# Patient Record
Sex: Male | Born: 1945 | Race: Black or African American | Hispanic: No
Health system: Southern US, Community
[De-identification: ages and names within clinical notes are randomized; demographics above are authoritative.]

## PROBLEM LIST (undated history)

## (undated) DIAGNOSIS — K651 Peritoneal abscess: Secondary | ICD-10-CM

## (undated) DIAGNOSIS — F4310 Post-traumatic stress disorder, unspecified: Secondary | ICD-10-CM

## (undated) DIAGNOSIS — G629 Polyneuropathy, unspecified: Secondary | ICD-10-CM

## (undated) DIAGNOSIS — E119 Type 2 diabetes mellitus without complications: Secondary | ICD-10-CM

## (undated) DIAGNOSIS — F0390 Unspecified dementia without behavioral disturbance: Secondary | ICD-10-CM

## (undated) DIAGNOSIS — D509 Iron deficiency anemia, unspecified: Secondary | ICD-10-CM

## (undated) HISTORY — PX: OSTOMY: SHX5997

## (undated) HISTORY — PX: REVISION UROSTOMY CUTANEOUS: SUR1282

---

## 2013-12-15 MED ORDER — NALOXONE 0.4 MG/ML INJECTION
0.4 mg/mL | INTRAMUSCULAR | Status: DC
Start: 2013-12-15 — End: 2013-12-15

## 2013-12-15 MED ORDER — SODIUM CHLORIDE 0.9% BOLUS IV
0.9 % | Freq: Once | INTRAVENOUS | Status: DC
Start: 2013-12-15 — End: 2013-12-16

## 2013-12-15 MED ORDER — SODIUM CHLORIDE 0.9 % IJ SYRG
INTRAMUSCULAR | Status: DC | PRN
Start: 2013-12-15 — End: 2013-12-16

## 2013-12-15 MED ADMIN — naloxone (NARCAN) 0.4 mg/mL injection: INTRAVENOUS | NDC 00409121501

## 2013-12-15 MED FILL — NALOXONE 0.4 MG/ML INJECTION: 0.4 mg/mL | INTRAMUSCULAR | Qty: 3

## 2013-12-15 NOTE — ED Notes (Signed)
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 Brought in by friend, was unresponsive and ashen. Narcan give, pt is now awake

## 2013-12-15 NOTE — ED Provider Notes (Signed)
Patient is a 68 y.o. male presenting with Ingested Medication. The history is provided by the patient, a friend and the EMS personnel. No language interpreter was used.   Drug Overdose  This is a new problem. The current episode started less than 1 hour ago. The problem occurs constantly. The problem has been gradually improving. Associated symptoms include shortness of breath. Pertinent negatives include no chest pain, no abdominal pain and no headaches. Nothing aggravates the symptoms. Nothing relieves the symptoms. He has tried nothing for the symptoms.        Past Medical History   Diagnosis Date   ??? Hypertension    ??? Diabetes    ??? Cancer      prostate        Past Surgical History   Procedure Laterality Date   ??? Hx prostatectomy           History reviewed. No pertinent family history.     History     Social History   ??? Marital Status: N/A     Spouse Name: N/A     Number of Children: N/A   ??? Years of Education: N/A     Occupational History   ??? Not on file.     Social History Main Topics   ??? Smoking status: Former Smoker   ??? Smokeless tobacco: Not on file   ??? Alcohol Use: Yes   ??? Drug Use: Yes     Special: Heroin   ??? Sexually Active: Not on file     Other Topics Concern   ??? Not on file     Social History Narrative   ??? No narrative on file                  ALLERGIES: Review of patient's allergies indicates no known allergies.      Review of Systems   Constitutional: Negative.  Negative for fever, chills and diaphoresis.   HENT: Negative.  Negative for congestion, sore throat and rhinorrhea.    Respiratory: Positive for apnea and shortness of breath. Negative for cough and chest tightness.    Cardiovascular: Negative.  Negative for chest pain, palpitations and leg swelling.   Gastrointestinal: Negative.  Negative for nausea, vomiting, abdominal pain and diarrhea.   Genitourinary: Negative.  Negative for dysuria, urgency and hematuria.   Musculoskeletal: Negative.  Negative for myalgias and arthralgias.   Skin:  Negative.  Negative for rash.   Allergic/Immunologic: Negative.  Negative for immunocompromised state.   Neurological: Negative.  Negative for weakness, light-headedness and headaches.   All other systems reviewed and are negative.        Filed Vitals:    12/15/13 1844   BP: 206/79   Resp: 11   Height: 5\' 11"  (1.803 Lezley Bedgood)   Weight: 117.935 kg (260 lb)   SpO2: 100%            Physical Exam   Nursing note and vitals reviewed.  Constitutional: He is oriented to person, place, and time. He appears well-developed and well-nourished. He appears distressed.   HENT:   Head: Normocephalic and atraumatic.   Eyes: Conjunctivae are normal. Pupils are equal, round, and reactive to light. Right eye exhibits no discharge. Left eye exhibits no discharge.   Cardiovascular: Normal rate, regular rhythm and normal heart sounds.  Exam reveals no gallop and no friction rub.    No murmur heard.  Pulmonary/Chest: Effort normal and breath sounds normal. No respiratory distress. He has no wheezes. He  has no rales.   Neurological: He is oriented to person, place, and time. He is unresponsive. GCS eye subscore is 1. GCS verbal subscore is 1. GCS motor subscore is 1.   Skin: He is not diaphoretic.        MDM     Differential Diagnosis; Clinical Impression; Plan:     68 y.o. male brought in by friends stating that he had overdosed on Heroine. Patient was unresponsive and not breathing. Patient was given   Narcan and started breathing and became alert and talking.   Blood pressure 206/79, resp. rate 11, height 5\' 11"  (1.803 Jasn Xia), weight 117.935 kg (260 lb), SpO2 100.00%.    Plan:     Medicate, labs, fluids and reevaluate  Progress:   Patient progress:  Stable      Procedures    Labs Reviewed   METABOLIC PANEL, COMPREHENSIVE - Abnormal; Notable for the following:     Glucose 204 (*)     BUN 21 (*)     GFR est non-AA 59 (*)     AST 59 (*)     Albumin 3.2 (*)     All other components within normal limits   CBC WITH AUTOMATED DIFF - Abnormal; Notable for  the following:     RBC 3.67 (*)     HGB 11.2 (*)     HCT 30.7 (*)     MCHC 36.5 (*)     RDW 15.3 (*)     ABS. MONOCYTES 0.3 (*)     All other components within normal limits   TROPONIN I     CXR 8:33 PM   [x]  Viewed by me    [x]  Interpreted by me  []  No acute disease   []  Abnormal : see below   []  Discussed with       Radiologist     []  Normal mediastinum []  No infiltrates  []  Normal heart size   Interpretation: no infiltrate noted

## 2013-12-15 NOTE — ED Notes (Signed)
Call placed to patient's friend Ms Forde RadonCarletta who stated that she would come to ED to pick him up.

## 2013-12-15 NOTE — ED Notes (Signed)
Mr. Herbert Patterson stated that [place] came from [place] about a week ago to [facility].  He said that he visited some old friends and end up overdosing on heroin.  Mr. Herbert Patterson stated that he have been abusing substances off and on for over 35 years.  Patient was offered assistance with getting on the road to recovery, but he said that he is set-up to get treatment at the [facility].  I commended Mr. Herbert Patterson on taking that first step to fight his disease of addiction.  Patient was given [place]'s contact information.

## 2013-12-15 NOTE — ED Notes (Addendum)
Resting quietly. Voices no new questions/ complaints NAD!

## 2013-12-16 LAB — METABOLIC PANEL, COMPREHENSIVE
A-G Ratio: 1 (ref 1.0–3.1)
ALT (SGPT): 49 U/L (ref 12.0–78.0)
AST (SGOT): 59 U/L — ABNORMAL HIGH (ref 15–37)
Albumin: 3.2 g/dL — ABNORMAL LOW (ref 3.40–5.00)
Alk. phosphatase: 72 U/L (ref 46–116)
Anion gap: 16 mmol/L (ref 10–17)
BUN/Creatinine ratio: 16 (ref 6.0–20.0)
BUN: 21 MG/DL — ABNORMAL HIGH (ref 7–18)
Bilirubin, total: 0.4 MG/DL (ref 0.20–1.00)
CO2: 24 mmol/L (ref 21–32)
Calcium: 8.8 MG/DL (ref 8.5–10.1)
Chloride: 107 mmol/L (ref 98–107)
Creatinine: 1.3 MG/DL (ref 0.6–1.3)
GFR est AA: >60 mL/min/{1.73_m2} (ref >60)
GFR est non-AA: 59 mL/min/{1.73_m2} — ABNORMAL LOW (ref >60)
Globulin: 3.2 g/dL
Glucose: 204 mg/dL — ABNORMAL HIGH (ref 74–106)
Potassium: 4 mmol/L (ref 3.50–5.10)
Protein, total: 6.4 g/dL (ref 6.40–8.20)
Sodium: 143 mmol/L (ref 136–145)

## 2013-12-16 LAB — EKG, 12 LEAD, INITIAL
Atrial Rate: 72 {beats}/min
Calculated P Axis: 36 degrees
Calculated R Axis: 40 degrees
Calculated T Axis: 41 degrees
Diagnosis: NORMAL
P-R Interval: 156 ms
Q-T Interval: 438 ms
QRS Duration: 128 ms
QTC Calculation (Bezet): 479 ms
Ventricular Rate: 72 {beats}/min

## 2013-12-16 LAB — CBC WITH AUTOMATED DIFF
ABS. BASOPHILS: 0 10*3/uL (ref 0.0–0.2)
ABS. EOSINOPHILS: 0.2 10*3/uL (ref 0.0–0.7)
ABS. LYMPHOCYTES: 2.6 10*3/uL (ref 1.2–3.4)
ABS. MONOCYTES: 0.3 10*3/uL — ABNORMAL LOW (ref 1.1–3.2)
ABS. NEUTROPHILS: 4.7 10*3/uL (ref 1.4–6.5)
BASOPHILS: 0 % (ref 0–2)
EOSINOPHILS: 2 % (ref 0–5)
HCT: 30.7 % — ABNORMAL LOW (ref 36.8–45.2)
HGB: 11.2 g/dL — ABNORMAL LOW (ref 12.8–15.0)
IMMATURE GRANULOCYTES: 0.3 % (ref 0.0–5.0)
LYMPHOCYTES: 34 % (ref 16–40)
MCH: 30.5 PG (ref 27–31)
MCHC: 36.5 g/dL — ABNORMAL HIGH (ref 32–36)
MCV: 83.7 FL (ref 81–99)
MONOCYTES: 3 % (ref 0–12)
MPV: 9.9 FL (ref 7.4–10.4)
NEUTROPHILS: 61 % (ref 40–70)
PLATELET: 227 10*3/uL (ref 140–450)
RBC: 3.67 M/uL — ABNORMAL LOW (ref 4.0–5.2)
RDW: 15.3 % — ABNORMAL HIGH (ref 11.5–14.5)
WBC: 7.8 10*3/uL (ref 4.8–10.8)

## 2013-12-16 LAB — TROPONIN I: Troponin-I: <0.05 ng/mL (ref <0.05)

## 2013-12-16 MED ORDER — NALOXONE 0.4 MG/ML INJECTION
0.4 mg/mL | INTRAMUSCULAR | Status: DC
Start: 2013-12-16 — End: 2013-12-16

## 2013-12-16 MED ORDER — NALOXONE 0.4 MG/ML INJECTION
0.4 mg/mL | INTRAMUSCULAR | Status: AC
Start: 2013-12-16 — End: 2013-12-15
  Administered 2013-12-16: via INTRAMUSCULAR

## 2013-12-16 NOTE — ED Notes (Signed)
Still no sign of patient's friend - called her cell phone ,unable to leave message as mail box is full. Sent text message . Meantime, patient called his son to pick him up.

## 2013-12-16 NOTE — ED Notes (Signed)
I have reviewed discharge instructions with the patient.  The patient verbalized understanding.

## 2015-12-21 DIAGNOSIS — G8918 Other acute postprocedural pain: Secondary | ICD-10-CM

## 2015-12-21 NOTE — ED Notes (Signed)
Dr. Lorayne Marek has reviewed discharge instructions with the patient.  The patient verbalized understanding. Patient taken to [facility] via [facility], appears in NAD.

## 2015-12-21 NOTE — ED Notes (Signed)
JP drain flushed per MD order, patient tolerated well.

## 2015-12-21 NOTE — ED Provider Notes (Signed)
HPI Comments: Herbert Patterson is a 70 y.o. male with pmhx significant for HTN, DM who presents via EMS to the ED c/o suprapubic tenderness at the site of his JP drain, unchanged since surgery 3 days ago. He states the JP drain has decreased output and wants to have it removed. Per pt, the drain was flushed 2 days ago by nurses at [facility]. He specifically denies any fevers, chills, nausea, vomiting, redness at site, diarrhea, constipation, urinary symptoms.    PCP: PROVIDER UNKNOWN  Surgeon: [facility]    There are no other complaints, changes, or physical findings at this time.    The history is provided by the patient.        Past Medical History:   Diagnosis Date   ??? Cancer [facility])      prostate   ??? Diabetes (HCC)    ??? Hypertension        Past Surgical History:   Procedure Laterality Date   ??? Hx prostatectomy     ??? Hx urological           History reviewed. No pertinent family history.    Social History     Social History   ??? Marital status: SINGLE     Spouse name: N/A   ??? Number of children: N/A   ??? Years of education: N/A     Occupational History   ??? Not on file.     Social History Main Topics   ??? Smoking status: Current Some Day Smoker     Packs/day: 0.50   ??? Smokeless tobacco: Not on file   ??? Alcohol use Yes   ??? Drug use: Yes     Special: Heroin   ??? Sexual activity: Not on file     Other Topics Concern   ??? Not on file     Social History Narrative         ALLERGIES: Review of patient's allergies indicates no known allergies.    Review of Systems   Constitutional: Negative for chills and fever.   Respiratory: Negative for cough and shortness of breath.    Cardiovascular: Negative for chest pain.   Gastrointestinal: Positive for abdominal pain (pain at JP drain site). Negative for constipation, diarrhea, nausea and vomiting.   Neurological: Negative for weakness and numbness.   All other systems reviewed and are negative.      Vitals:    12/21/15 1922 12/21/15 1930   BP: 112/63 108/60   Pulse: 91    Resp: 18     Temp: 97.9 ??F (36.6 ??C)    SpO2: 97% 97%   Weight: 83.9 kg (185 lb)    Height:  (1.803 m)             Physical Exam   Constitutional: He is oriented to person, place, and time. He appears well-developed and well-nourished. No distress.   HENT:   Head: Normocephalic and atraumatic.   Eyes: Conjunctivae and EOM are normal.   Neck: Normal range of motion. Neck supple.   Cardiovascular: Normal rate and regular rhythm.    Pulmonary/Chest: Effort normal and breath sounds normal. No respiratory distress.   Abdominal: He exhibits no distension. There is no tenderness.   illiostomy bag, ostomy bag, JP drain from suprapubic region  Mild tenderness surrounding suprapubic catheter  No fluctuance or erythema noted   JP drain has minimal drainage.   Abd soft, non-tender otherwise   Musculoskeletal: Normal range of motion.   Neurological: He  is alert and oriented to person, place, and time.   Skin: Skin is warm and dry.   Psychiatric: He has a normal mood and affect.   Nursing note and vitals reviewed.       MDM  Number of Diagnoses or Management Options  History of bladder surgery:   Post-op pain:   Diagnosis management comments: Pt presents for decrease in JP drain output. DDx: obstruction of JP drain, broken JP, normal decreased output. Pain is not any worse than his baseline since surgery and no signs of acute abd here. Will flush drain to make sure no obstruction and have him follow-up with surgery.       Amount and/or Complexity of Data Reviewed  Review and summarize past medical records: yes    Patient Progress  Patient progress: stable    ED Course       Procedures    Progress Note  8:17 PM  I spoke to the patient???s nurse at [facility]. She asked the pt to be transferred to the [place]. Told her he does not have an acute abd and his pain is same as first placed. At this time, no emergent need to go to the [place].  Nurse asked [place] to flush JP drain and asked [place] to send flush instructions with the patient.   Written by Vivien Rota, ED Scribe, as dictated by Royanne Foots, M.D.    MEDICATIONS GIVEN:  Medications   oxyCODONE-acetaminophen (PERCOCET) 5-325 mg per tablet 1 Tab (not administered)       IMPRESSION:  1. Post-op pain    2. History of bladder surgery        PLAN:  1.   Current Discharge Medication List      CONTINUE these medications which have NOT CHANGED    Details   METFORMIN HCL (METFORMIN PO) Take  by mouth.           2.   Follow-up Information     Follow up With Details Comments Contact Info    [facility]  About drain removal          3. Return to ED if worse     DISCHARGE NOTE  8:06 PM  The patient has been re-evaluated and is ready for discharge. Reviewed available results with patient. Counseled patient on diagnosis and care plan. Patient has expressed understanding, and all questions have been answered. Patient agrees with plan and agrees to follow up as recommended, or return to the ED if their symptoms worsen. Discharge instructions have been provided and explained to the patient, along with reasons to return to the ED.    This note is prepared by Vivien Rota, acting as Scribe for Royanne Foots, M.D.  Royanne Foots, M.D.: The the scribe's documentation has been prepared under my direction and personally reviewed by me in its entirety. I confirm that the note above accurately reflects all work, treatment, procedures, and medical decision making performed by me.
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
## 2015-12-22 ENCOUNTER — Inpatient Hospital Stay
Admit: 2015-12-22 | Discharge: 2015-12-22 | Disposition: A | Discharge status: Home / Self Care | Payer: MEDICARE | Attending: Emergency Medicine

## 2015-12-22 MED ORDER — OXYCODONE-ACETAMINOPHEN 5 MG-325 MG TAB
5-325 mg | ORAL | Status: AC
Start: 2015-12-22 — End: 2015-12-21
  Administered 2015-12-22: 01:00:00 via ORAL

## 2015-12-22 MED FILL — OXYCODONE-ACETAMINOPHEN 5 MG-325 MG TAB: 5-325 mg | ORAL | Qty: 1

## 2016-02-02 ENCOUNTER — Emergency Department: Admit: 2016-02-02 | Payer: MEDICARE | Primary: Student in an Organized Health Care Education/Training Program

## 2016-02-02 ENCOUNTER — Inpatient Hospital Stay
Admit: 2016-02-02 | Discharge: 2016-02-02 | Disposition: A | Discharge status: Home / Self Care | Payer: MEDICARE | Attending: Emergency Medicine

## 2016-02-02 DIAGNOSIS — T82524A Displacement of infusion catheter, initial encounter: Secondary | ICD-10-CM

## 2016-02-02 MED ORDER — SODIUM BICARBONATE 4 % IV
4 % | Freq: Once | INTRAVENOUS | Status: AC
Start: 2016-02-02 — End: 2016-02-02
  Administered 2016-02-02: 21:00:00 via SUBCUTANEOUS

## 2016-02-02 MED ORDER — LIDOCAINE HCL 2 % (20 MG/ML) IJ SOLN
20 mg/mL (2 %) | Freq: Once | INTRAMUSCULAR | Status: AC
Start: 2016-02-02 — End: 2016-02-02
  Administered 2016-02-02: 21:00:00 via SUBCUTANEOUS

## 2016-02-02 MED FILL — XYLOCAINE 20 MG/ML (2 %) INJECTION SOLUTION: 20 mg/mL (2 %) | INTRAMUSCULAR | Qty: 10

## 2016-02-02 MED FILL — NEUT 4 % INTRAVENOUS SOLUTION: 4 % | INTRAVENOUS | Qty: 1

## 2016-02-02 NOTE — Procedures (Signed)
PROCEDURE:PICC replacement.  INDICATION:dislodged.  ANESTHESIA:local.  COMPLICATION:NONE.  SPECIMENS REMOVED:none.  BLOOD LOSS:NONE.  OPERATOR/ASSISTANT:Krystyl Cannell, M.D.  RECOMMENDATIONS:may use.  CONSENT OBTAINED:YES.  NOTES:none.

## 2016-02-02 NOTE — ED Notes (Signed)
Patient given meal tray.

## 2016-02-02 NOTE — Procedures (Signed)
PROCEDURE:PICC replacement.  INDICATION:dislodged.  ANESTHESIA:local.  COMPLICATION:NONE.  SPECIMENS REMOVED:none.  BLOOD LOSS:NONE.  OPERATOR/ASSISTANT:Chaos Carlile, M.D.  RECOMMENDATIONS:may use.  CONSENT OBTAINED:YES.  NOTES:none.

## 2016-02-02 NOTE — ED Notes (Signed)
Patient ambulatory out of ED with yellow cab ticket.

## 2016-02-02 NOTE — ED Triage Notes (Signed)
Pt. In no distress. LUE intact. Dressing remains in place until PICC team is consulted.

## 2016-02-02 NOTE — ED Notes (Signed)
Patient OTF to IR for PICC placement.

## 2016-02-02 NOTE — Progress Notes (Signed)
Care manager interviewed patient at bedside. RRAT: 5.    The patient is a 70 year old male presenting to ED from [facility] [phone], [address]) [facility]. Care management consult requested by RN re: transportation home. Patient is ambulatory, alert and oriented. He has Medicare coverage only (CM confirmed this with patient and with [facility] staff). Provided patient with Yellow Cab Voucher, CM placed reservation for transportation.     Care Management Interventions  PCP Verified by CM: Yes  Mode of Transport at Discharge: Other (see comment) (cab voucher)  Transition of Care Consult (CM Consult): Discharge Planning  Current Support Network: Nursing Facility  Confirm Follow Up Transport: Cab  Plan discussed with Pt/Family/Caregiver: Yes (cab voucher)  Discharge Location  Discharge Placement: Skilled nursing facility    Freeman SpurAlexandra Taunja Brickner, MSW  [phone]

## 2016-02-02 NOTE — ED Provider Notes (Signed)
HPI Comments: Herbert PayorJames O Ullmer is a 70 y.o. male with PMHx significant for prostate CA,infected rectal fistula who presents from via EMS [facility] to the ED for PICC line replacement after having it migrate out of its insertion site this morning. He thinks it happened while he was sleeping last night. 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 He is unsure when he is scheduled to come off the ABx but states that he has been taking them "for weeks". Pt denies any bleeding or drainage from the site. He denies any CP, SOB, abdominal pain, N/V/D.     PCP: PROVIDER UNKNOWN    Social hx: smoking (.5 ppd) EtOH (+)    There are no other complaints, changes, or physical findings at this time.       The history is provided by the patient.        Past Medical History:   Diagnosis Date   ??? Cancer [facility](HCC)     prostate   ??? Diabetes (HCC)    ??? Hypertension        Past Surgical History:   Procedure Laterality Date   ??? HX PROSTATECTOMY     ??? HX UROLOGICAL           No family history on file.    Social History     Social History   ??? Marital status: SINGLE     Spouse name: N/A   ??? Number of children: N/A   ??? Years of education: N/A     Occupational History   ??? Not on file.     Social History Main Topics   ??? Smoking status: Current Some Day Smoker     Packs/day: 0.50   ??? Smokeless tobacco: Not on file   ??? Alcohol use Yes   ??? Drug use: Yes     Special: Heroin   ??? Sexual activity: Not on file     Other Topics Concern   ??? Not on file     Social History Narrative         ALLERGIES: Review of patient's allergies indicates no known allergies.    Review of Systems   Constitutional: Negative for chills, diaphoresis, fever and unexpected weight change.   HENT: Negative for rhinorrhea and sore throat.    Eyes: Negative for pain.   Respiratory: Negative for shortness of breath, wheezing and stridor.    Cardiovascular: Negative for chest pain and leg swelling.    Gastrointestinal: Negative for abdominal pain, blood in stool, diarrhea, nausea and vomiting.   Genitourinary: Negative for difficulty urinating, dysuria and flank pain.   Musculoskeletal: Negative for back pain and neck stiffness.   Skin: Negative for rash.        + PICC line out of place.    Neurological: Negative for seizures, syncope, weakness, light-headedness and headaches.   Psychiatric/Behavioral: Negative for confusion.       Visit Vitals   ??? BP 119/63 (BP 1 Location: Right arm, BP Patient Position: At rest)   ??? Pulse (!) 58   ??? Resp 18   ??? Ht 5\' 11"  (1.803 m)   ??? Wt 90.7 kg (200 lb)   ??? SpO2 99%   ??? BMI 27.89 kg/m2       Physical Exam   Constitutional: He is oriented to person, place, and time. He appears well-developed and well-nourished. No distress.   HENT:   Nose: Nose normal.   Mouth/Throat: Oropharynx is  clear and moist. No oropharyngeal exudate.   Eyes: Conjunctivae and EOM are normal. Pupils are equal, round, and reactive to light. Right eye exhibits no discharge. Left eye exhibits no discharge. No scleral icterus.   Neck: Normal range of motion. Neck supple. No JVD present.   Cardiovascular: Normal rate, regular rhythm, normal heart sounds and intact distal pulses.    No murmur heard.  Pulmonary/Chest: Effort normal and breath sounds normal. No stridor. No respiratory distress. He has no wheezes. He has no rales.   Abdominal: Soft. Bowel sounds are normal. He exhibits no distension. There is no tenderness. There is no rebound and no guarding.   Urostomy in RLQ. Colostomy in LLQ.    Musculoskeletal: He exhibits no edema or tenderness.   Neurological: He is alert and oriented to person, place, and time.   Skin: Skin is warm and dry. He is not diaphoretic.   PICC line over the left upper extremity, almost completely migrated out.    Psychiatric: He has a normal mood and affect.   Nursing note and vitals reviewed.       MDM  Number of Diagnoses or Management Options   Needs peripherally inserted central catheter (PICC):   Diagnosis management comments: Sent for migrated PICC line that was sucessfully replaced in the ED. Stable for discharge.        Amount and/or Complexity of Data Reviewed  Review and summarize past medical records: yes    Patient Progress  Patient progress: stable    ED Course       Procedures    MEDICATIONS GIVEN:  Medications   lidocaine (XYLOCAINE) 20 mg/mL (2 %) injection 100 mg (100 mg SubCUTAneous Given 02/02/16 1645)   sodium bicarbonate (NEUT) injection 2 mL (1 mL SubCUTAneous Given 02/02/16 1645)       IMPRESSION:  1. Needs peripherally inserted central catheter (PICC)        PLAN:  1.   Current Discharge Medication List        2.   Follow-up Information     Follow up With Details Comments Contact Info    Primary Care Doctor Call in 2 days As needed     [facility]  As needed, If symptoms worsen [address]  [place] [address]  [phone]        Return to ED if worse       DISCHARGE NOTE  5:50 PM  The patient has been re-evaluated and is ready for discharge. Reviewed available results with patient. Counseled pt on diagnosis and care plan. Pt has expressed understanding, and all questions have been answered. Pt agrees with plan and agrees to F/U as recommended, or return to the ED if their sxs worsen. Discharge instructions have been provided and explained to the pt, along with reasons to return to the ED.  Written by Baird Lyons, ED Scribe, as dictated by Mickel Crow, MD.    This note is prepared by Baird Lyons, acting as Scribe for Mickel Crow, MD.    Mickel Crow, MD: The scribe's documentation has been prepared under my direction and personally reviewed by me in its entirety. I confirm that the note above accurately reflects all work, treatment, procedures, and medical decision making performed by me.

## 2016-07-16 ENCOUNTER — Emergency Department: Payer: Non-veteran care

## 2016-07-16 ENCOUNTER — Inpatient Hospital Stay
Admission: EM | Admit: 2016-07-16 | Discharge: 2016-07-17 | DRG: 871 | Disposition: A | Discharge status: Other Acute Inpatient Hospital | Payer: Non-veteran care | Attending: Internal Medicine | Admitting: Internal Medicine

## 2016-07-16 DIAGNOSIS — E119 Type 2 diabetes mellitus without complications: Secondary | ICD-10-CM | POA: Diagnosis present

## 2016-07-16 DIAGNOSIS — G9340 Encephalopathy, unspecified: Secondary | ICD-10-CM | POA: Diagnosis present

## 2016-07-16 DIAGNOSIS — Z79899 Other long term (current) drug therapy: Secondary | ICD-10-CM | POA: Diagnosis not present

## 2016-07-16 DIAGNOSIS — Z832 Family history of diseases of the blood and blood-forming organs and certain disorders involving the immune mechanism: Secondary | ICD-10-CM

## 2016-07-16 DIAGNOSIS — N390 Urinary tract infection, site not specified: Secondary | ICD-10-CM | POA: Diagnosis present

## 2016-07-16 DIAGNOSIS — Z8546 Personal history of malignant neoplasm of prostate: Secondary | ICD-10-CM | POA: Diagnosis not present

## 2016-07-16 DIAGNOSIS — N321 Vesicointestinal fistula: Secondary | ICD-10-CM | POA: Diagnosis present

## 2016-07-16 DIAGNOSIS — R4182 Altered mental status, unspecified: Secondary | ICD-10-CM

## 2016-07-16 DIAGNOSIS — N289 Disorder of kidney and ureter, unspecified: Secondary | ICD-10-CM | POA: Diagnosis present

## 2016-07-16 DIAGNOSIS — D649 Anemia, unspecified: Secondary | ICD-10-CM

## 2016-07-16 DIAGNOSIS — C61 Malignant neoplasm of prostate: Secondary | ICD-10-CM | POA: Diagnosis present

## 2016-07-16 DIAGNOSIS — A419 Sepsis, unspecified organism: Principal | ICD-10-CM | POA: Diagnosis present

## 2016-07-16 DIAGNOSIS — F4310 Post-traumatic stress disorder, unspecified: Secondary | ICD-10-CM | POA: Diagnosis present

## 2016-07-16 DIAGNOSIS — Z933 Colostomy status: Secondary | ICD-10-CM

## 2016-07-16 DIAGNOSIS — F0390 Unspecified dementia without behavioral disturbance: Secondary | ICD-10-CM | POA: Diagnosis present

## 2016-07-16 DIAGNOSIS — Z79891 Long term (current) use of opiate analgesic: Secondary | ICD-10-CM

## 2016-07-16 HISTORY — DX: Post-traumatic stress disorder, unspecified: F43.10

## 2016-07-16 HISTORY — DX: Type 2 diabetes mellitus without complications: E11.9

## 2016-07-16 HISTORY — DX: Polyneuropathy, unspecified: G62.9

## 2016-07-16 HISTORY — DX: Peritoneal abscess: K65.1

## 2016-07-16 HISTORY — DX: Iron deficiency anemia, unspecified: D50.9

## 2016-07-16 HISTORY — DX: Unspecified dementia, unspecified severity, without behavioral disturbance, psychotic disturbance, mood disturbance, and anxiety: F03.90

## 2016-07-16 LAB — COMPREHENSIVE METABOLIC PANEL
ALBUMIN: 2 g/dL — AB (ref 3.5–5.0)
ALT: 31 U/L (ref 17–63)
AST: 65 U/L — AB (ref 15–41)
Alkaline Phosphatase: 81 U/L (ref 38–126)
Anion gap: 9 (ref 5–15)
BILIRUBIN TOTAL: 0.6 mg/dL (ref 0.3–1.2)
BUN: 62 mg/dL — AB (ref 6–20)
CHLORIDE: 105 mmol/L (ref 101–111)
CO2: 19 mmol/L — AB (ref 22–32)
Calcium: 8.5 mg/dL — ABNORMAL LOW (ref 8.9–10.3)
Creatinine, Ser: 1.71 mg/dL — ABNORMAL HIGH (ref 0.61–1.24)
GFR calc Af Amer: 45 mL/min — ABNORMAL LOW (ref >60)
GFR calc non Af Amer: 39 mL/min — ABNORMAL LOW (ref >60)
GLUCOSE: 177 mg/dL — AB (ref 65–99)
POTASSIUM: 5.2 mmol/L — AB (ref 3.5–5.1)
SODIUM: 133 mmol/L — AB (ref 135–145)
TOTAL PROTEIN: 8.2 g/dL — AB (ref 6.5–8.1)

## 2016-07-16 LAB — CBC WITH DIFFERENTIAL/PLATELET
Basophils Absolute: 0 10*3/uL (ref 0–0.1)
Basophils Relative: 0 %
Eosinophils Absolute: 0 10*3/uL (ref 0–0.7)
Eosinophils Relative: 0 %
HEMATOCRIT: 25.5 % — AB (ref 40.0–52.0)
HEMOGLOBIN: 8.5 g/dL — AB (ref 13.0–18.0)
LYMPHS ABS: 0.6 10*3/uL — AB (ref 1.0–3.6)
LYMPHS PCT: 3 %
MCH: 22.5 pg — AB (ref 26.0–34.0)
MCHC: 33.3 g/dL (ref 32.0–36.0)
MCV: 67.5 fL — AB (ref 80.0–100.0)
MONO ABS: 1.4 10*3/uL — AB (ref 0.2–1.0)
MONOS PCT: 7 %
NEUTROS ABS: 19.3 10*3/uL — AB (ref 1.4–6.5)
NEUTROS PCT: 90 %
Platelets: 708 10*3/uL — ABNORMAL HIGH (ref 150–440)
RBC: 3.78 MIL/uL — ABNORMAL LOW (ref 4.40–5.90)
RDW: 21.4 % — AB (ref 11.5–14.5)
WBC: 21.4 10*3/uL — ABNORMAL HIGH (ref 3.8–10.6)

## 2016-07-16 LAB — BLOOD GAS, VENOUS
Acid-base deficit: 6 mmol/L — ABNORMAL HIGH (ref 0.0–2.0)
BICARBONATE: 18.6 meq/L — AB (ref 21.0–28.0)
O2 SAT: 86.2 %
PATIENT TEMPERATURE: 37
PO2 VEN: 54 mmHg — AB (ref 31.0–45.0)
pCO2, Ven: 33 mmHg — ABNORMAL LOW (ref 44.0–60.0)
pH, Ven: 7.36 (ref 7.320–7.430)

## 2016-07-16 LAB — URINALYSIS COMPLETE WITH MICROSCOPIC (ARMC ONLY)
BILIRUBIN URINE: NEGATIVE
GLUCOSE, UA: NEGATIVE mg/dL
Nitrite: NEGATIVE
PH: 6 (ref 5.0–8.0)
Protein, ur: 30 mg/dL — AB
Specific Gravity, Urine: 1.015 (ref 1.005–1.030)

## 2016-07-16 LAB — LACTIC ACID, PLASMA: Lactic Acid, Venous: 1.9 mmol/L (ref 0.5–1.9)

## 2016-07-16 MED ORDER — SODIUM CHLORIDE 0.9 % IV BOLUS (SEPSIS)
1000.0000 mL | Freq: Once | INTRAVENOUS | Status: AC
  Administered 2016-07-16: 1000 mL via INTRAVENOUS

## 2016-07-16 MED ORDER — CEFEPIME HCL 2 G IJ SOLR
2.0000 g | Freq: Once | INTRAMUSCULAR | Status: AC
  Administered 2016-07-16: 2 g via INTRAVENOUS
  Filled 2016-07-16: qty 2

## 2016-07-16 MED ORDER — DEXTROSE 5 % IV SOLN: 2.0000 g | INTRAVENOUS | Status: DC

## 2016-07-16 NOTE — ED Notes (Signed)
Pt has urostomy and colostomy bags.

## 2016-07-16 NOTE — H&P (Signed)
[facility] NAME: Lonnie Morales    MR#:  [id number]  DATE OF BIRTH:  August 06, 1946  DATE OF ADMISSION:  07/16/2016  PRIMARY CARE PHYSICIAN: Alvester Morin, MD   REQUESTING/REFERRING PHYSICIAN: Mariea Clonts, MD  CHIEF COMPLAINT:   Chief Complaint  Patient presents with  . Altered Mental Status    HISTORY OF PRESENT ILLNESS:  Lonnie Morales  is a 70 y.o. male who presents with Confusion and weakness. Patient also states he has had some dysuria. He is unable to contribute much more information to history of present illness.  PAST MEDICAL HISTORY:   Past Medical History:  Diagnosis Date  . Dementia   . Diabetes mellitus without complication ([place])   . Iron deficiency anemia   . Neuropathy ([place])   . Peritoneal abscess ([place])   . PTSD (post-traumatic stress disorder)     PAST SURGICAL HISTORY:   Past Surgical History:  Procedure Laterality Date  . OSTOMY    . REVISION UROSTOMY CUTANEOUS      SOCIAL HISTORY:   Social History  Substance Use Topics  . Smoking status: Unknown If Ever Smoked  . Smokeless tobacco: Never Used  . Alcohol use No    FAMILY HISTORY:   Family History  Problem Relation Age of Onset  . Sickle cell anemia Father     DRUG ALLERGIES:  No Known Allergies  MEDICATIONS AT HOME:   Prior to Admission medications   Medication Sig Start Date End Date Taking? Authorizing Provider  acetaminophen (TYLENOL) 325 MG tablet Take 650 mg by mouth every 6 (six) hours.   Yes Historical Provider, MD  albuterol (PROVENTIL HFA;VENTOLIN HFA) 108 (90 Base) MCG/ACT inhaler Inhale 2 puffs into the lungs every 6 (six) hours as needed (asthma attack).   Yes Historical Provider, MD  fentaNYL (DURAGESIC - DOSED MCG/HR) 25 MCG/HR patch Place 25 mcg onto the skin every 3 (three) days.   Yes Historical Provider, MD  ferrous sulfate 324 (65 Fe) MG TBEC Take 324 mg by mouth 3 (three) times daily.   Yes Historical  Provider, MD  folic acid (FOLVITE) 1 MG tablet Take 1 mg by mouth daily.   Yes Historical Provider, MD  gabapentin (NEURONTIN) 100 MG capsule Take 100 mg by mouth 3 (three) times daily.   Yes Historical Provider, MD  meloxicam (MOBIC) 15 MG tablet Take 15 mg by mouth daily.   Yes Historical Provider, MD  miconazole (MICOTIN) 2 % powder Apply 1 application topically 3 (three) times daily.   Yes Historical Provider, MD  oxyCODONE (OXY IR/ROXICODONE) 5 MG immediate release tablet Take 5 mg by mouth every 6 (six) hours.   Yes Historical Provider, MD  Oxycodone HCl 10 MG TABS Take 10 mg by mouth every 4 (four) hours as needed (pain).   Yes Historical Provider, MD  pantoprazole (PROTONIX) 20 MG tablet Take 20 mg by mouth daily.   Yes Historical Provider, MD    REVIEW OF SYSTEMS:  Review of Systems  Unable to perform ROS: Acuity of condition     VITAL SIGNS:   Vitals:   07/16/16 2130 07/16/16 2200 07/16/16 2230 07/16/16 2237  BP: 116/76 122/61 103/61 103/61  Pulse:    74  Resp: 17 20 12 16   Temp:      TempSrc:      SpO2:    100%  Weight:      Height:       Wt Readings from Last 3  Encounters:  07/16/16 83.7 kg (184 lb 8 oz)    PHYSICAL EXAMINATION:  Physical Exam  Vitals reviewed. Constitutional: He is oriented to person, place, and time. He appears well-developed and well-nourished. No distress.  HENT:  Head: Normocephalic and atraumatic.  Mouth/Throat: Oropharynx is clear and moist.  Eyes: Conjunctivae and EOM are normal. Pupils are equal, round, and reactive to light. No scleral icterus.  Neck: Normal range of motion. Neck supple. No JVD present. No thyromegaly present.  Cardiovascular: Normal rate, regular rhythm and intact distal pulses.  Exam reveals no gallop and no friction rub.   No murmur heard. Respiratory: Effort normal and breath sounds normal. No respiratory distress. He has no wheezes. He has no rales.  GI: Soft. Bowel sounds are normal. He exhibits no distension.  There is no tenderness.  Musculoskeletal: Normal range of motion. He exhibits no edema.  No arthritis, no gout  Lymphadenopathy:    He has no cervical adenopathy.  Neurological: He is alert and oriented to person, place, and time. No cranial nerve deficit.  No dysarthria, no aphasia  Skin: Skin is warm and dry. No rash noted. No erythema.  Psychiatric: He has a normal mood and affect. His behavior is normal. Judgment and thought content normal.    LABORATORY PANEL:   CBC  Recent Labs Lab 07/16/16 1857  WBC 21.4*  HGB 8.5*  HCT 25.5*  PLT 708*   ------------------------------------------------------------------------------------------------------------------  Chemistries   Recent Labs Lab 07/16/16 1857  NA 133*  K 5.2*  CL 105  CO2 19*  GLUCOSE 177*  BUN 62*  CREATININE 1.71*  CALCIUM 8.5*  AST 65*  ALT 31  ALKPHOS 81  BILITOT 0.6   ------------------------------------------------------------------------------------------------------------------  Cardiac Enzymes No results for input(s): TROPONINI in the last 168 hours. ------------------------------------------------------------------------------------------------------------------  RADIOLOGY:  Dg Chest 1 View  Result Date: 07/16/2016 CLINICAL DATA:  Altered mental status. Pt has strong urine odor. Pt has both urostomy and colostomy. Pt would not hold still for images. EXAM: CHEST 1 VIEW COMPARISON:  None. FINDINGS: Shallow inspiration. Mild linear atelectasis in the lung bases. Normal heart size and pulmonary vascularity. No focal airspace disease or consolidation in the lungs. No blunting of costophrenic angles. No pneumothorax. Mediastinal contours appear intact. Degenerative changes in the spine. IMPRESSION: Shallow inspiration with linear atelectasis in the lung bases. Electronically Signed   By: Lucienne Capers M.D.   On: 07/16/2016 20:47    EKG:   Orders placed or performed during the hospital encounter  of 07/16/16  . EKG 12-Lead  . EKG 12-Lead    IMPRESSION AND PLAN:  Principal Problem:   Sepsis ([place]) - IV antibiotics started in the ED and continued on admission. Hemodynamically stable, lactic acid within normal limits. Cultures sent from the ED Active Problems:   UTI (lower urinary tract infection) - IV antibiotics and cultures   Diabetes (HCC) - sliding scale insulin with corresponding glucose checks and carb modified diet   Dementia - continue home meds  All the records are reviewed and case discussed with ED provider. Management plans discussed with the patient and/or family.  DVT PROPHYLAXIS: SubQ heparin  GI PROPHYLAXIS: PPI  ADMISSION STATUS: Inpatient  CODE STATUS: Full Code Status History    This patient does not have a recorded code status. Please follow your organizational policy for patients in this situation.    Advance Directive Documentation   Flowsheet Row Most Recent Value  Type of Advance Directive  Out of facility DNR (pink MOST or  yellow form)  Pre-existing out of facility DNR order (yellow form or pink MOST form)  No data  "MOST" Form in Place?  No data      TOTAL TIME TAKING CARE OF THIS PATIENT: 45 minutes.    Steele Stracener FIELDING 07/16/2016, 11:08 PM  [facility]  Office  [phone]  CC: Primary care physician; Alvester Morin, MD

## 2016-07-16 NOTE — ED Provider Notes (Signed)
[facility] Emergency Department Provider Note  ____________________________________________  Time seen: Approximately 7:42 PM  I have reviewed the triage vital signs and the nursing notes.   HISTORY  Chief Complaint Altered Mental Status    HPI Lonnie Morales is a 70 y.o. male nursing home patient with a history of colostomy and urostomy sent by EMS for altered mental status. The patient is unable to tell me why he is here but states that he is not having any pain or discomfort. The report is that the patient is usually combative but today he was very docile and the nurses were concerned about this change in his mental status. I do not have any additional information and the patient is unable to provide any further history.   Past Medical History:  Diagnosis Date  . Dementia   . Diabetes mellitus without complication ([place])   . Iron deficiency anemia   . Neuropathy ([place])   . Peritoneal abscess ([place])   . PTSD (post-traumatic stress disorder)     There are no active problems to display for this patient.   Past Surgical History:  Procedure Laterality Date  . OSTOMY    . REVISION UROSTOMY CUTANEOUS        Allergies Review of patient's allergies indicates no known allergies.  No family history on file.  Social History Social History  Substance Use Topics  . Smoking status: Unknown If Ever Smoked  . Smokeless tobacco: Never Used  . Alcohol use No    Review of Systems Unable to obtain other than a report of altered mental status and the patient telling me that he does not have any pain, and has not had any nausea or vomiting.  ____________________________________________   PHYSICAL EXAM:  VITAL SIGNS: ED Triage Vitals  Enc Vitals Group     BP 07/16/16 1842 (!) 100/46     Pulse Rate 07/16/16 1842 90     Resp 07/16/16 1842 20     Temp 07/16/16 1927 99.1 F (37.3 C)     Temp Source 07/16/16 1927 Rectal     SpO2 07/16/16 1842 100 %      Weight 07/16/16 1922 184 lb 8 oz (83.7 kg)     Height 07/16/16 1922 6' (1.829 m)     Head Circumference --      Peak Flow --      Pain Score --      Pain Loc --      Pain Edu? --      Excl. in [place]? --     Constitutional:Patient is alert and oriented to person and place but not year, he states it is 2003. Chronically ill appearing but in no acute distress.  Eyes: Conjunctivae are normal.  EOMI. No scleral icterus. No discharge. Head: Atraumatic. Nose: No congestion/rhinnorhea. Mouth/Throat: Mucous membranes are dry.  Neck: No stridor.  Supple.  No JVD. Cardiovascular: Normal rate, regular rhythm. No murmurs, rubs or gallops.  Respiratory: Normal respiratory effort.  No accessory muscle use or retractions. Lungs CTAB.  No wheezes, rales or ronchi. Gastrointestinal: Abdomen is soft, nondistended and nontender. The patient has a urostomy bag that has dark yellow urine.  Musculoskeletal: No LE edema.  Neurologic:  Patient is alert and oriented 2. Speech is clear.  Face and smile are symmetric.  EOMI.  Moves all extremities well. Skin:  Skin is warm, dry and intact. No rash noted. Psychiatric: Mood and affect are normal.   ____________________________________________   LABS (all labs  ordered are listed, but only abnormal results are displayed)  Labs Reviewed  COMPREHENSIVE METABOLIC PANEL - Abnormal; Notable for the following:       Result Value   Sodium 133 (*)    Potassium 5.2 (*)    CO2 19 (*)    Glucose, Bld 177 (*)    BUN 62 (*)    Creatinine, Ser 1.71 (*)    Calcium 8.5 (*)    Total Protein 8.2 (*)    Albumin 2.0 (*)    AST 65 (*)    GFR calc non Af Amer 39 (*)    GFR calc Af Amer 45 (*)    All other components within normal limits  CBC WITH DIFFERENTIAL/PLATELET - Abnormal; Notable for the following:    WBC 21.4 (*)    RBC 3.78 (*)    Hemoglobin 8.5 (*)    HCT 25.5 (*)    MCV 67.5 (*)    MCH 22.5 (*)    RDW 21.4 (*)    Platelets 708 (*)    Neutro Abs 19.3 (*)     Lymphs Abs 0.6 (*)    Monocytes Absolute 1.4 (*)    All other components within normal limits  URINALYSIS COMPLETEWITH MICROSCOPIC ([facility] ONLY) - Abnormal; Notable for the following:    Color, Urine AMBER (*)    APPearance CLOUDY (*)    Ketones, ur TRACE (*)    Hgb urine dipstick 2+ (*)    Protein, ur 30 (*)    Leukocytes, UA 1+ (*)    Bacteria, UA MANY (*)    Squamous Epithelial / LPF 0-5 (*)    All other components within normal limits  CULTURE, BLOOD (ROUTINE X 2)  CULTURE, BLOOD (ROUTINE X 2)  URINE CULTURE  LACTIC ACID, PLASMA  LACTIC ACID, PLASMA   ____________________________________________  EKG  ED ECG REPORT I, Eula Listen, the attending physician, personally viewed and interpreted this ECG.   Date: 07/16/2016  EKG Time: 1917  Rate: 85  Rhythm: normal sinus rhythm, right panel branch block.  Axis: normal  Intervals:none  ST&T Change: Right bundle branch block no STEMI.  ____________________________________________  RADIOLOGY  No results found.  ____________________________________________   PROCEDURES  Procedure(s) performed: None  Procedures  Critical Care performed: No ____________________________________________   INITIAL IMPRESSION / ASSESSMENT AND PLAN / ED COURSE  Pertinent labs & imaging results that were available during my care of the patient were reviewed by me and considered in my medical decision making (see chart for details).  70 y.o. male nursing home patient with colostomy and urostomy presenting with altered mental status. On arrival to the emergency department, the patient has a temperature of 99.1 but no true fever. His vital signs are otherwise stable and he is able to answer some questions although he is not oriented to year and it is unclear whether this is baseline. I am concerned that this patient may have early sepsis. His labs are concerning for renal insufficiency with hyponatremia and mild hyperkalemia. In  addition he has a white blood cell count of 21.4 with bacteria and leukocytes in his urine. He also has anemia with a hematocrit of 25.5. I do not have any previous laboratory studies on this patient and I am unable to compare to see what his baseline is.  ____________________________________________  FINAL CLINICAL IMPRESSION(S) / ED DIAGNOSES  Final diagnoses:  None    Clinical Course   ----------------------------------------- 9:31 PM on 07/16/2016 -----------------------------------------  The patient continues to state  that he has no discomfort at this time. When I went to reevaluate him, his last recorded blood pressure was 99 over 50s, but I rechecked it and it was 116 over 60s. The patient is currently receiving his IV fluids and he has received his antibiotics. Plan admission at this time.   NEW MEDICATIONS STARTED DURING THIS VISIT:  New Prescriptions   No medications on file      Eula Listen, MD 07/16/16 2134

## 2016-07-16 NOTE — Progress Notes (Addendum)
Pharmacy Antibiotic Note  Lonnie Morales is a 70 y.o. male admitted on 07/16/2016 with UTI.  Pharmacy has been consulted for cefepime dosing.  Plan: Cefepime 2 grams q 24 hours ordered/  Height: 6' (182.9 cm) Weight: 184 lb 8 oz (83.7 kg) IBW/kg (Calculated) : 77.6  Temp (24hrs), Avg:99.1 F (37.3 C), Min:99.1 F (37.3 C), Max:99.1 F (37.3 C)   Recent Labs Lab 07/16/16 1857  WBC 21.4*  CREATININE 1.71*  LATICACIDVEN 1.9    Estimated Creatinine Clearance: 44.7 mL/min (by C-G formula based on SCr of 1.71 mg/dL).    No Known Allergies  Antimicrobials this admission: cefepime  >>    >>   Dose adjustments this admission: ED MD d/c original order shortly after entry. Upon discussion with hospitalist will reinstate order but with dosing for sepsis.   Microbiology results: 8/26 BCx: pending 8/26 UCx: pending    8/26 UA: LE(+) NO2(-) WBC 6-30  Thank you for allowing pharmacy to be a part of this patient's care.  Saanya Zieske S 07/16/2016 11:33 PM

## 2016-07-16 NOTE — ED Triage Notes (Signed)
Pt came to ED from [facility] for AMS. Per staff, pt is normally combative but he is not today. Pt has strong urine odor. Pt has both urostomy and colostomy.

## 2016-07-17 ENCOUNTER — Encounter: Payer: Self-pay | Admitting: *Deleted

## 2016-07-17 ENCOUNTER — Ambulatory Visit (HOSPITAL_COMMUNITY)
Admission: AD | Admit: 2016-07-17 | Discharge: 2016-07-17 | Disposition: A | Discharge status: Home / Self Care | Payer: Non-veteran care | Source: Other Acute Inpatient Hospital | Attending: Internal Medicine | Admitting: Internal Medicine

## 2016-07-17 DIAGNOSIS — C61 Malignant neoplasm of prostate: Secondary | ICD-10-CM | POA: Insufficient documentation

## 2016-07-17 LAB — HEMOGLOBIN A1C: Hgb A1c MFr Bld: 5.7 % (ref 4.0–6.0)

## 2016-07-17 LAB — GLUCOSE, CAPILLARY
GLUCOSE-CAPILLARY: 221 mg/dL — AB (ref 65–99)
GLUCOSE-CAPILLARY: 204 mg/dL — AB (ref 65–99)
GLUCOSE-CAPILLARY: 154 mg/dL — AB (ref 65–99)
Glucose-Capillary: 119 mg/dL — ABNORMAL HIGH (ref 65–99)
Glucose-Capillary: 150 mg/dL — ABNORMAL HIGH (ref 65–99)

## 2016-07-17 LAB — CBC
HEMATOCRIT: 24 % — AB (ref 40.0–52.0)
HEMOGLOBIN: 8.2 g/dL — AB (ref 13.0–18.0)
MCH: 23 pg — AB (ref 26.0–34.0)
MCHC: 34.1 g/dL (ref 32.0–36.0)
MCV: 67.4 fL — AB (ref 80.0–100.0)
Platelets: 704 10*3/uL — ABNORMAL HIGH (ref 150–440)
RBC: 3.57 MIL/uL — ABNORMAL LOW (ref 4.40–5.90)
RDW: 21.6 % — ABNORMAL HIGH (ref 11.5–14.5)
WBC: 20.1 10*3/uL — ABNORMAL HIGH (ref 3.8–10.6)

## 2016-07-17 LAB — BASIC METABOLIC PANEL
ANION GAP: 5 (ref 5–15)
BUN: 51 mg/dL — ABNORMAL HIGH (ref 6–20)
CALCIUM: 8.3 mg/dL — AB (ref 8.9–10.3)
CHLORIDE: 112 mmol/L — AB (ref 101–111)
CO2: 20 mmol/L — AB (ref 22–32)
Creatinine, Ser: 1.29 mg/dL — ABNORMAL HIGH (ref 0.61–1.24)
GFR calc Af Amer: >60 mL/min (ref >60)
GFR calc non Af Amer: 55 mL/min — ABNORMAL LOW (ref >60)
GLUCOSE: 125 mg/dL — AB (ref 65–99)
Potassium: 5 mmol/L (ref 3.5–5.1)
Sodium: 137 mmol/L (ref 135–145)

## 2016-07-17 LAB — LACTIC ACID, PLASMA: LACTIC ACID, VENOUS: 1.6 mmol/L (ref 0.5–1.9)

## 2016-07-17 LAB — MRSA PCR SCREENING: MRSA BY PCR: POSITIVE — AB

## 2016-07-17 MED ORDER — PANTOPRAZOLE SODIUM 40 MG PO TBEC
40.0000 mg | DELAYED_RELEASE_TABLET | Freq: Every day | ORAL | Status: DC
  Administered 2016-07-17: 40 mg via ORAL
  Filled 2016-07-17: qty 1

## 2016-07-17 MED ORDER — FENTANYL 25 MCG/HR TD PT72
25.0000 ug | MEDICATED_PATCH | TRANSDERMAL | Status: DC
  Administered 2016-07-17: 25 ug via TRANSDERMAL
  Filled 2016-07-17: qty 1

## 2016-07-17 MED ORDER — SODIUM POLYSTYRENE SULFONATE 15 GM/60ML PO SUSP
15.0000 g | Freq: Once | ORAL | Status: AC
  Administered 2016-07-17: 15 g via ORAL
  Filled 2016-07-17: qty 60

## 2016-07-17 MED ORDER — SODIUM CHLORIDE 0.9 % IV SOLN
INTRAVENOUS | Status: DC
  Administered 2016-07-17: 04:00:00 via INTRAVENOUS

## 2016-07-17 MED ORDER — SODIUM CHLORIDE 0.9 % IV SOLN
1.0000 g | Freq: Three times a day (TID) | INTRAVENOUS | Status: DC
  Administered 2016-07-17: 1 g via INTRAVENOUS
  Filled 2016-07-17 (×3): qty 1

## 2016-07-17 MED ORDER — ONDANSETRON HCL 4 MG PO TABS: 4.0000 mg | ORAL_TABLET | Freq: Four times a day (QID) | ORAL | Status: DC | PRN

## 2016-07-17 MED ORDER — SODIUM CHLORIDE 0.9% FLUSH
3.0000 mL | Freq: Two times a day (BID) | INTRAVENOUS | Status: DC
  Administered 2016-07-17: 3 mL via INTRAVENOUS

## 2016-07-17 MED ORDER — HEPARIN SODIUM (PORCINE) 5000 UNIT/ML IJ SOLN
5000.0000 [IU] | Freq: Three times a day (TID) | INTRAMUSCULAR | Status: DC
  Administered 2016-07-17: 5000 [IU] via SUBCUTANEOUS
  Filled 2016-07-17 (×2): qty 1

## 2016-07-17 MED ORDER — PANTOPRAZOLE SODIUM 20 MG PO TBEC
20.0000 mg | DELAYED_RELEASE_TABLET | Freq: Every day | ORAL | Status: DC
  Filled 2016-07-17: qty 1

## 2016-07-17 MED ORDER — GABAPENTIN 100 MG PO CAPS
100.0000 mg | ORAL_CAPSULE | Freq: Three times a day (TID) | ORAL | Status: DC
  Administered 2016-07-17 (×2): 100 mg via ORAL
  Filled 2016-07-17 (×2): qty 1

## 2016-07-17 MED ORDER — OXYCODONE HCL 5 MG PO TABS
5.0000 mg | ORAL_TABLET | Freq: Four times a day (QID) | ORAL | Status: DC
  Administered 2016-07-17 (×2): 5 mg via ORAL
  Filled 2016-07-17 (×2): qty 1

## 2016-07-17 MED ORDER — FENTANYL 12 MCG/HR TD PT72
25.0000 ug | MEDICATED_PATCH | TRANSDERMAL | Status: DC
  Administered 2016-07-17: 25 ug via TRANSDERMAL
  Filled 2016-07-17 (×2): qty 2

## 2016-07-17 MED ORDER — CEFEPIME HCL 2 G IJ SOLR
2.0000 g | Freq: Two times a day (BID) | INTRAMUSCULAR | Status: DC
  Administered 2016-07-17: 2 g via INTRAVENOUS
  Filled 2016-07-17 (×2): qty 2

## 2016-07-17 MED ORDER — MELOXICAM 15 MG PO TABS
15.0000 mg | ORAL_TABLET | Freq: Every day | ORAL | Status: DC
  Filled 2016-07-17: qty 1

## 2016-07-17 MED ORDER — CHLORHEXIDINE GLUCONATE CLOTH 2 % EX PADS
6.0000 | MEDICATED_PAD | Freq: Every day | CUTANEOUS | Status: DC
  Administered 2016-07-17: 6 via TOPICAL

## 2016-07-17 MED ORDER — MUPIROCIN 2 % EX OINT
1.0000 "application " | TOPICAL_OINTMENT | Freq: Two times a day (BID) | CUTANEOUS | Status: DC
  Administered 2016-07-17: 1 via NASAL
  Filled 2016-07-17: qty 22

## 2016-07-17 MED ORDER — INSULIN ASPART 100 UNIT/ML ~~LOC~~ SOLN
0.0000 [IU] | Freq: Three times a day (TID) | SUBCUTANEOUS | Status: DC
  Administered 2016-07-17: 1 [IU] via SUBCUTANEOUS
  Administered 2016-07-17: 2 [IU] via SUBCUTANEOUS
  Administered 2016-07-17: 3 [IU] via SUBCUTANEOUS
  Filled 2016-07-17: qty 1
  Filled 2016-07-17: qty 5
  Filled 2016-07-17: qty 2

## 2016-07-17 MED ORDER — INSULIN ASPART 100 UNIT/ML ~~LOC~~ SOLN: 0.0000 [IU] | Freq: Every day | SUBCUTANEOUS | Status: DC

## 2016-07-17 MED ORDER — OXYCODONE HCL 5 MG PO TABS
10.0000 mg | ORAL_TABLET | ORAL | Status: DC | PRN
  Administered 2016-07-17: 10 mg via ORAL
  Filled 2016-07-17: qty 2

## 2016-07-17 MED ORDER — MELOXICAM 7.5 MG PO TABS
7.5000 mg | ORAL_TABLET | Freq: Two times a day (BID) | ORAL | Status: DC
  Administered 2016-07-17: 7.5 mg via ORAL
  Filled 2016-07-17 (×2): qty 1

## 2016-07-17 MED ORDER — ACETAMINOPHEN 325 MG PO TABS
650.0000 mg | ORAL_TABLET | Freq: Four times a day (QID) | ORAL | Status: DC | PRN
  Administered 2016-07-17: 650 mg via ORAL
  Filled 2016-07-17: qty 2

## 2016-07-17 MED ORDER — ONDANSETRON HCL 4 MG/2ML IJ SOLN: 4.0000 mg | Freq: Four times a day (QID) | INTRAMUSCULAR | Status: DC | PRN

## 2016-07-17 MED ORDER — ACETAMINOPHEN 650 MG RE SUPP: 650.0000 mg | Freq: Four times a day (QID) | RECTAL | Status: DC | PRN

## 2016-07-17 NOTE — Progress Notes (Signed)
Received report from [facility].Expressed concerns about abnormal lab values and no Trop. Dr. Jannifer Franklin notified no new orders at this time. Patient remains in ED.

## 2016-07-17 NOTE — Progress Notes (Signed)
Received call from lab staff. Patient positive for MRSA. Contact precautions initiated.

## 2016-07-17 NOTE — NC FL2 (Signed)
[place] LEVEL OF CARE SCREENING TOOL     IDENTIFICATION  Patient Name: Lonnie Morales Birthdate: August 18, 1946 Sex: male Admission Date (Current Location): 07/16/2016  [place] and [place] Number:  Engineering geologist and Address:  [facility], [address], [place],  [address]      Provider Number: [id number]  Attending Physician Name and Address:  Dustin Flock, MD  Relative Name and Phone Number:       Current Level of Care: Hospital Recommended Level of Care: [place] Prior Approval Number:    Date Approved/Denied: 05/26/16 PASRR Number: [id number]  Discharge Plan: SNF    Current Diagnoses: Patient Active Problem List   Diagnosis Date Noted  . Sepsis ([place]) 07/16/2016  . UTI (lower urinary tract infection) 07/16/2016  . Diabetes ([place]) 07/16/2016  . Dementia 07/16/2016    Orientation RESPIRATION BLADDER Height & Weight        Normal Incontinent Weight: 191 lb 6.4 oz (86.8 kg) Height:  6' (182.9 cm)  BEHAVIORAL SYMPTOMS/MOOD NEUROLOGICAL BOWEL NUTRITION STATUS      Incontinent    AMBULATORY STATUS COMMUNICATION OF NEEDS Skin   Limited Assist Verbally Normal                       Personal Care Assistance Level of Assistance  Total care, Dressing, Feeding, Bathing Bathing Assistance: Limited assistance Feeding assistance: Limited assistance Dressing Assistance: Limited assistance Total Care Assistance: Limited assistance   Functional Limitations Info             SPECIAL CARE FACTORS FREQUENCY                       Contractures Contractures Info: Present    Additional Factors Info                  Current Medications (07/17/2016):  This is the current hospital active medication list Current Facility-Administered Medications  Medication Dose Route Frequency Provider Last Rate Last Dose  . 0.9 %  sodium chloride infusion   Intravenous Continuous Lance Coon, MD 100  mL/hr at 07/17/16 0351    . acetaminophen (TYLENOL) tablet 650 mg  650 mg Oral Q6H PRN Lance Coon, MD   650 mg at 07/17/16 [id number]   Or  . acetaminophen (TYLENOL) suppository 650 mg  650 mg Rectal Q6H PRN Lance Coon, MD      . ceFEPIme (MAXIPIME) 2 g in dextrose 5 % 50 mL IVPB  2 g Intravenous Q12H Lance Coon, MD   2 g at 07/17/16 [id number]  . Chlorhexidine Gluconate Cloth 2 % PADS 6 each  6 each Topical Q0600 Lance Coon, MD   6 each at 07/17/16 0540  . fentaNYL (DURAGESIC - dosed mcg/hr) patch 25 mcg  25 mcg Transdermal Q72H Dustin Flock, MD      . gabapentin (NEURONTIN) capsule 100 mg  100 mg Oral TID Lance Coon, MD   100 mg at 07/17/16 0902  . heparin injection 5,000 Units  5,000 Units Subcutaneous Q8H Lance Coon, MD      . insulin aspart (novoLOG) injection 0-5 Units  0-5 Units Subcutaneous QHS Lance Coon, MD      . insulin aspart (novoLOG) injection 0-9 Units  0-9 Units Subcutaneous TID WC Lance Coon, MD   1 Units at 07/17/16 0901  . meloxicam (MOBIC) tablet 15 mg  15 mg Oral Daily Dustin Flock, MD      . mupirocin  ointment (BACTROBAN) 2 % 1 application  1 application Nasal BID Lance Coon, MD   1 application at [id number] [phone]  . ondansetron (ZOFRAN) tablet 4 mg  4 mg Oral Q6H PRN Lance Coon, MD       Or  . ondansetron [facility]) injection 4 mg  4 mg Intravenous Q6H PRN Lance Coon, MD      . oxyCODONE (Oxy IR/ROXICODONE) immediate release tablet 10 mg  10 mg Oral Q4H PRN Dustin Flock, MD      . oxyCODONE (Oxy IR/ROXICODONE) immediate release tablet 5 mg  5 mg Oral Q6H Dustin Flock, MD      . pantoprazole (PROTONIX) EC tablet 40 mg  40 mg Oral Daily Lance Coon, MD   40 mg at 07/17/16 0902  . sodium chloride flush (NS) 0.9 % injection 3 mL  3 mL Intravenous Q12H Lance Coon, MD   3 mL at 07/17/16 0204     Discharge Medications: Please see discharge summary for a list of discharge medications.  Relevant Imaging Results:  Relevant Lab Results:   Additional  Information  SS [id number]  Zettie Pho, LCSW

## 2016-07-17 NOTE — Progress Notes (Signed)
[place] at [facility]                                                                                                                                                                                            Patient Demographics   Lonnie Morales, is a 70 y.o. male, DOB - [birth year], [id number]  Admit date - 07/16/2016   Admitting Physician Lance Coon, MD  Outpatient Primary MD for the patient is Alvester Morin, MD   LOS - 1  Subjective:Patient admitted with altered mental status however his mental status now is normal. 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 His noted to have significant stool output through that. Patient reports that he is being followed at the [place] and was last seen 3 months ago. He is feeling better denies any chest pains or shortness of breath no abdominal pain his WBC count is elevated at 20,000.     Review of Systems:   CONSTITUTIONAL: No documented fever. No fatigue, weakness. No weight gain, no weight loss.  EYES: No blurry or double vision.  ENT: No tinnitus. No postnasal drip. No redness of the oropharynx.  RESPIRATORY: No cough, no wheeze, no hemoptysis. No dyspnea.  CARDIOVASCULAR: No chest pain. No orthopnea. No palpitations. No syncope.  GASTROINTESTINAL: No nausea, no vomiting or diarrhea. No abdominal pain. No melena or hematochezia.  GENITOURINARY: No dysuria or hematuria.  ENDOCRINE: No polyuria or nocturia. No heat or cold intolerance.  HEMATOLOGY: No anemia. No bruising. No bleeding.  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 No swelling. No gout.  NEUROLOGIC: No numbness, tingling, or ataxia. No seizure-type activity.  PSYCHIATRIC: No anxiety. No insomnia. No ADD.    Vitals:   Vitals:   07/17/16 0000 07/17/16 0046 07/17/16 0157 07/17/16 0935  BP: 110/61  (!) 110/53  120/71  Pulse:   100 100  Resp: 15  20 18   Temp:  98.5 F (36.9 C) 97.7 F (36.5 C) 97.8 F (36.6 C)  TempSrc:  Oral Oral Oral  SpO2:   96% 100%  Weight:   86.8 kg (191 lb 6.4 oz)   Height:        Wt Readings from Last 3 Encounters:  07/17/16 86.8 kg (191 lb 6.4 oz)     Intake/Output Summary (Last 24 hours) at 07/17/16 1257 Last data filed at 07/17/16 1129  Gross per 24 hour  Intake             3428 ml  Output  1800 ml  Net             1628 ml    Physical Exam:   GENERAL: Pleasant-appearing in no apparent distress.  HEAD, EYES, EARS, NOSE AND THROAT: Atraumatic, normocephalic. Extraocular muscles are intact. Pupils equal and reactive to light. Sclerae anicteric. No conjunctival injection. No oro-pharyngeal erythema.  NECK: Supple. There is no jugular venous distention. No bruits, no lymphadenopathy, no thyromegaly.  HEART: Regular rate and rhythm,. No murmurs, no rubs, no clicks.  LUNGS: Clear to auscultation bilaterally. No rales or rhonchi. No wheezes.  ABDOMEN: Soft, flat, nontender, nondistended. Has good bowel sounds. No hepatosplenomegaly appreciated.  EXTREMITIES: No evidence of any cyanosis, clubbing, or peripheral edema.  +2 pedal and radial pulses bilaterally.  NEUROLOGIC: The patient is alert, awake, and oriented x3 with no focal motor or sensory deficits appreciated bilaterally.  SKIN: Suprapubic area there is an area of opening with foul-smelling stool Psych: Not anxious, depressed LN: No inguinal LN enlargement    Antibiotics   Anti-infectives    Start     Dose/Rate Route Frequency Ordered Stop   07/17/16 1800  ceFEPIme (MAXIPIME) 2 g in dextrose 5 % 50 mL IVPB  Status:  Discontinued     2 g 100 mL/hr over 30 Minutes Intravenous Every 24 hours 07/16/16 2332 07/16/16 2338   07/17/16 1000  ceFEPIme (MAXIPIME) 2 g in dextrose 5 % 50 mL IVPB     2 g 100 mL/hr over 30 Minutes Intravenous Every 12 hours 07/17/16 0304     07/16/16 2015  ceFEPIme  (MAXIPIME) 2 g in dextrose 5 % 50 mL IVPB     2 g 100 mL/hr over 30 Minutes Intravenous  Once 07/16/16 1944 07/16/16 2047      Medications   Scheduled Meds: . ceFEPime (MAXIPIME) IV  2 g Intravenous Q12H  . Chlorhexidine Gluconate Cloth  6 each Topical Q0600  . fentaNYL  25 mcg Transdermal Q72H  . gabapentin  100 mg Oral TID  . heparin  5,000 Units Subcutaneous Q8H  . insulin aspart  0-5 Units Subcutaneous QHS  . insulin aspart  0-9 Units Subcutaneous TID WC  . meloxicam  7.5 mg Oral BID  . mupirocin ointment  1 application Nasal BID  . oxyCODONE  5 mg Oral Q6H  . pantoprazole  40 mg Oral Daily  . sodium chloride flush  3 mL Intravenous Q12H   Continuous Infusions:  PRN Meds:.acetaminophen **OR** acetaminophen, ondansetron **OR** ondansetron (ZOFRAN) IV, oxyCODONE   Data Review:   Micro Results Recent Results (from the past 240 hour(s))  Culture, blood (Routine x 2)     Status: None (Preliminary result)   Collection Time: 07/16/16  6:57 PM  Result Value Ref Range Status   Specimen Description BLOOD LEFT UPPER ARM  Final   Special Requests   Final    BOTTLES DRAWN AEROBIC AND ANAEROBIC AER [place]   Culture NO GROWTH < 12 HOURS  Final   Report Status PENDING  Incomplete  Culture, blood (Routine x 2)     Status: None (Preliminary result)   Collection Time: 07/16/16  7:57 PM  Result Value Ref Range Status   Specimen Description BLOOD LEFT FATTY CASTS  Final   Special Requests BOTTLES DRAWN AEROBIC AND ANAEROBIC [place]  Final   Culture NO GROWTH < 12 HOURS  Final   Report Status PENDING  Incomplete  MRSA PCR Screening     Status: Abnormal   Collection Time: 07/17/16  1:41  AM  Result Value Ref Range Status   MRSA by PCR POSITIVE (A) NEGATIVE Final    Comment:        The GeneXpert MRSA Assay (FDA approved for NASAL specimens only), is one component of a comprehensive MRSA colonization surveillance program. It is not intended to diagnose MRSA infection nor  to guide or monitor treatment for MRSA infections. CALLED JASMINE DORSETT AT 0422 ON 07/17/16 RWW READ BACK   Blood Culture (routine x 2)     Status: None (Preliminary result)   Collection Time: 07/17/16  2:38 AM  Result Value Ref Range Status   Specimen Description BLOOD RIGHT ASSIST CONTROL  Final   Special Requests   Final    BOTTLES DRAWN AEROBIC AND ANAEROBIC 10CCAERO,6CCANA   Culture NO GROWTH < 12 HOURS  Final   Report Status PENDING  Incomplete  Blood Culture (routine x 2)     Status: None (Preliminary result)   Collection Time: 07/17/16  2:52 AM  Result Value Ref Range Status   Specimen Description BLOOD RIGHT HAND  Final   Special Requests BOTTLES DRAWN AEROBIC AND ANAEROBIC [place]  Final   Culture NO GROWTH < 12 HOURS  Final   Report Status PENDING  Incomplete    Radiology Reports Dg Chest 1 View  Result Date: 07/16/2016 CLINICAL DATA:  Altered mental status. Pt has strong urine odor. Pt has both urostomy and colostomy. Pt would not hold still for images. EXAM: CHEST 1 VIEW COMPARISON:  None. FINDINGS: Shallow inspiration. Mild linear atelectasis in the lung bases. Normal heart size and pulmonary vascularity. No focal airspace disease or consolidation in the lungs. No blunting of costophrenic angles. No pneumothorax. Mediastinal contours appear intact. Degenerative changes in the spine. IMPRESSION: Shallow inspiration with linear atelectasis in the lung bases. Electronically Signed   By: Lucienne Capers M.D.   On: 07/16/2016 20:47     CBC  Recent Labs Lab 07/16/16 1857 07/17/16 0238  WBC 21.4* 20.1*  HGB 8.5* 8.2*  HCT 25.5* 24.0*  PLT 708* 704*  MCV 67.5* 67.4*  MCH 22.5* 23.0*  MCHC 33.3 34.1  RDW 21.4* 21.6*  LYMPHSABS 0.6*  --   MONOABS 1.4*  --   EOSABS 0.0  --   BASOSABS 0.0  --     Chemistries   Recent Labs Lab 07/16/16 1857 07/17/16 0238  NA 133* 137  K 5.2* 5.0  CL 105 112*  CO2 19* 20*  GLUCOSE 177* 125*  BUN 62* 51*  CREATININE 1.71*  1.29*  CALCIUM 8.5* 8.3*  AST 65*  --   ALT 31  --   ALKPHOS 81  --   BILITOT 0.6  --    ------------------------------------------------------------------------------------------------------------------ estimated creatinine clearance is 59.3 mL/min (by C-G formula based on SCr of 1.29 mg/dL). ------------------------------------------------------------------------------------------------------------------ No results for input(s): HGBA1C in the last 72 hours. ------------------------------------------------------------------------------------------------------------------ No results for input(s): CHOL, HDL, LDLCALC, TRIG, CHOLHDL, LDLDIRECT in the last 72 hours. ------------------------------------------------------------------------------------------------------------------ No results for input(s): TSH, T4TOTAL, T3FREE, THYROIDAB in the last 72 hours.  Invalid input(s): FREET3 ------------------------------------------------------------------------------------------------------------------ No results for input(s): VITAMINB12, FOLATE, FERRITIN, TIBC, IRON, RETICCTPCT in the last 72 hours.  Coagulation profile No results for input(s): INR, PROTIME in the last 168 hours.  No results for input(s): DDIMER in the last 72 hours.  Cardiac Enzymes No results for input(s): CKMB, TROPONINI, MYOGLOBIN in the last 168 hours.  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  1. Acute encephalopathy now resolved suspect due to underlying infection 2. Sepsis with elevated WBC count I suspect this is related to his suprapubic fistula with  significant stool output WBC count continues to be elevated continue cefepime for now. He will need to have this addressed. His had his care at the [facility]. we will request transfer to their facility for further evaluation and therapy 3.   UTI (lower urinary tract infection) -minimally abnormal we'll await cultures continue antibiotics as per #2 4.  Diabetes ([place]) - sliding scale insulin with corresponding glucose checks and carb modified diet 5.  Dementia - continue home meds      Code Status Orders        Start     Ordered   07/17/16 0121  Full code  Continuous     07/17/16 0120    Code Status History    Date Active Date Inactive Code Status Order ID Comments User Context   This patient has a current code status but no historical code status.    Advance Directive Documentation   Flowsheet Row Most Recent Value  Type of Advance Directive  Out of facility DNR (pink MOST or yellow form)  Pre-existing out of facility DNR order (yellow form or pink MOST form)  No data  "MOST" Form in Place?  No data           ConsultsNone   DVT Prophylaxis  heparin  Lab Results  Component Value Date   PLT 704 (H) 07/17/2016     Time Spent in minutes  33min Greater than 50% of time spent in care coordination and counseling patient regarding the condition and plan of care.   Dustin Flock M.D on 07/17/2016 at 12:57 PM  Between 7am to 6pm - Pager - [phone]  After 6pm go to [email or web address] - password [email or web address] [facility]   Office  [phone]

## 2016-07-17 NOTE — Clinical Social Work Note (Signed)
Clinical Social Work Assessment  Patient Details  Name: Lonnie Morales MRN: [id number] Date of Birth: Jun 09, 1946  Date of referral:  07/17/16               Reason for consult:  Facility Placement                Permission sought to share information with:  Facility Art therapist granted to share information::  Yes, Verbal Permission Granted  Name::        Agency::  [facility]  Relationship::     Contact Information:  [phone]  Housing/Transportation Living arrangements for the past 2 months:  [place] of Information:  Facility Patient Interpreter Needed:  None Criminal Activity/Legal Involvement Pertinent to Current Situation/Hospitalization:  No - Comment as needed Significant Relationships:  Siblings Lives with:  Facility Resident Do you feel safe going back to the place where you live?  Yes Need for family participation in patient care:  No (Coment)  Care giving concerns:  Admitted from facility ([place])   Social Worker assessment / plan:  Patient has AMS. Assessment conducted with facility representative, Amy Allen.  Patient at baseline uses a Wc, has a urostomy and colostomy bag which he self changes, and is fairly independent with ADL functions. Recently, patient had a fall at the facility and was under head trauma protocol. Facility brought to [facility] ED due to AMS decline over 3 days, including inability to verbalize coherently. Patient does not have memory care issues at baseline.  Amy advised that patient does smoke on occasion when family comes to visit in case he become agitated. CSW will alert staff.  Amy reported that upon dc, patient is able to return to [facility].   Employment status:  Retired Nurse, adult PT Recommendations:  Not assessed at this time Information / Referral to community resources:     Patient/Family's Response to care:  Patient has AMS.  Patient/Family's  Understanding of and Emotional Response to Diagnosis, Current Treatment, and Prognosis: Patient has AMS.  Emotional Assessment Appearance:  Appears stated age Attitude/Demeanor/Rapport:   (Patient has AMS) Affect (typically observed):  Calm (Patient has AMS) Orientation:  Oriented to Self (Patient has AMS) Alcohol / Substance use:  Tobacco Use Psych involvement (Current and /or in the community):  No (Comment)  Discharge Needs  Concerns to be addressed:  Discharge Planning Concerns Readmission within the last 30 days:  No Current discharge risk:  None Barriers to Discharge:  Continued Medical Work up   [employer], LCSW 07/17/2016, 10:35 AM

## 2016-07-17 NOTE — Consult Note (Signed)
Pharmacy Antibiotic Note  Lonnie Morales is a 70 y.o. male admitted on 07/16/2016 with intra-abdominal infection.  Pharmacy has been consulted for meropenem dosing.  Plan: meropenem 1g q 8 hours  Height: 6' (182.9 cm) Weight: 191 lb 6.4 oz (86.8 kg) IBW/kg (Calculated) : 77.6  Temp (24hrs), Avg:98.3 F (36.8 C), Min:97.7 F (36.5 C), Max:99.1 F (37.3 C)   Recent Labs Lab 07/16/16 1857 07/17/16 0238  WBC 21.4* 20.1*  CREATININE 1.71* 1.29*  LATICACIDVEN 1.9 1.6    Estimated Creatinine Clearance: 59.3 mL/min (by C-G formula based on SCr of 1.29 mg/dL).    No Known Allergies  Antimicrobials this admission: cefepime 8/26 >> 8/17 meropenem 8/27 >>   Dose adjustments this admission:   Microbiology results: 8/27 BCx:   8/27 MRSA PCR: positive  Thank you for allowing pharmacy to be a part of this patient's care.  Ramond Dial 07/17/2016 3:16 PM

## 2016-07-17 NOTE — Progress Notes (Signed)
Report called to [place] at [facility].  Pt going to room 14 bed 1, 6th floor B wing.

## 2016-07-17 NOTE — Progress Notes (Signed)
Pts sister Berenice [facility])  notified that pt was being transferred to the [place] this evening.

## 2016-07-17 NOTE — Discharge Summary (Signed)
Colson Marcille, 70 y.o., DOB 08-19-1946, MRN [id number]. Admission date: 07/16/2016 Discharge Date 07/17/2016 Primary MD Alvester Morin, MD Admitting Physician Lance Coon, MD  Admission Diagnosis  UTI (lower urinary tract infection) [N39.0] Renal insufficiency [N28.9] Altered mental status, unspecified altered mental status type [R41.82] Anemia, unspecified anemia type [D64.9]  Discharge Diagnosis   Principal Problem:  Recurrent fistula  Sepsis ([place])  UTI (lower urinary tract infection)  Diabetes [facility])  Dementia         Hospital Course patient is a 70 year old African-American male currently residing in a nursing facility who was recently hospitalized in [place] in June due to complicated history of having fistula as well as osteomyelitis. Who was sent due to decrease in responsiveness. Initially patient had a UA which showed possible urinary tract infection. However on further evaluation he was noted to have a suprapubic fistula that is draining significant amount of stool. On further evaluation he was noted that he has a long history of infection and a complicated surgical history related to the fistula. He has received all his care for this at the [place] to her home. At this point I have contacted them for his transfer to further evaluate and treat this condition. He has been accepted by the [place].           Consults  None  Significant Tests:  See full reports for all details     Dg Chest 1 View  Result Date: 07/16/2016 CLINICAL DATA:  Altered mental status. Pt has strong urine odor. Pt has both urostomy and colostomy. Pt would not hold still for images. EXAM: CHEST 1 VIEW COMPARISON:  None. FINDINGS: Shallow inspiration. Mild linear atelectasis in the lung bases. Normal heart size and pulmonary vascularity. No focal airspace disease or consolidation in the lungs. No blunting of costophrenic angles. No pneumothorax. Mediastinal contours appear intact. Degenerative changes in  the spine. IMPRESSION: Shallow inspiration with linear atelectasis in the lung bases. Electronically Signed   By: Lucienne Capers M.D.   On: 07/16/2016 20:47       Today   Subjective:   Roselind Rily  mental status back to baseline  Objective:   Blood pressure 120/71, pulse 100, temperature 97.8 F (36.6 C), temperature source Oral, resp. rate 18, height 6' (1.829 m), weight 86.8 kg (191 lb 6.4 oz), SpO2 100 %.  .  Intake/Output Summary (Last 24 hours) at 07/17/16 1509 Last data filed at 07/17/16 1129  Gross per 24 hour  Intake             3428 ml  Output             1800 ml  Net             1628 ml    Exam VITAL SIGNS: Blood pressure 120/71, pulse 100, temperature 97.8 F (36.6 C), temperature source Oral, resp. rate 18, height 6' (1.829 m), weight 86.8 kg (191 lb 6.4 oz), SpO2 100 %.  GENERAL:  70 y.o.-year-old patient lying in the bed with no acute distress.  EYES: Pupils equal, round, reactive to light and accommodation. No scleral icterus. Extraocular muscles intact.  HEENT: Head atraumatic, normocephalic. Oropharynx and nasopharynx clear.  NECK:  Supple, no jugular venous distention. No thyroid enlargement, no tenderness.  LUNGS: Normal breath sounds bilaterally, no wheezing, rales,rhonchi or crepitation. No use of accessory muscles of respiration.  CARDIOVASCULAR: S1, S2 normal. No murmurs, rubs, or gallops.  ABDOMEN: Soft, nontender, nondistended. Bowel sounds present. No organomegaly or mass.  EXTREMITIES: No pedal edema, cyanosis, or clubbing.  NEUROLOGIC: Cranial nerves II through XII are intact. Muscle strength 5/5 in all extremities. Sensation intact. Gait not checked.  PSYCHIATRIC: The patient is alert and oriented x 3.  SKIN: Suprapubic area with a fistula with significant stool drainage   Data Review     CBC w Diff: Lab Results  Component Value Date   WBC 20.1 (H) 07/17/2016   HGB 8.2 (L) 07/17/2016   HCT 24.0 (L) 07/17/2016   PLT 704 (H) 07/17/2016    LYMPHOPCT 3 07/16/2016   MONOPCT 7 07/16/2016   EOSPCT 0 07/16/2016   BASOPCT 0 07/16/2016   CMP: Lab Results  Component Value Date   NA 137 07/17/2016   K 5.0 07/17/2016   CL 112 (H) 07/17/2016   CO2 20 (L) 07/17/2016   BUN 51 (H) 07/17/2016   CREATININE 1.29 (H) 07/17/2016   PROT 8.2 (H) 07/16/2016   ALBUMIN 2.0 (L) 07/16/2016   BILITOT 0.6 07/16/2016   ALKPHOS 81 07/16/2016   AST 65 (H) 07/16/2016   ALT 31 07/16/2016  .  Micro Results Recent Results (from the past 240 hour(s))  Culture, blood (Routine x 2)     Status: None (Preliminary result)   Collection Time: 07/16/16  6:57 PM  Result Value Ref Range Status   Specimen Description BLOOD LEFT UPPER ARM  Final   Special Requests   Final    BOTTLES DRAWN AEROBIC AND ANAEROBIC AER [place]   Culture NO GROWTH < 12 HOURS  Final   Report Status PENDING  Incomplete  Culture, blood (Routine x 2)     Status: None (Preliminary result)   Collection Time: 07/16/16  7:57 PM  Result Value Ref Range Status   Specimen Description BLOOD LEFT FATTY CASTS  Final   Special Requests BOTTLES DRAWN AEROBIC AND ANAEROBIC [place]  Final   Culture NO GROWTH < 12 HOURS  Final   Report Status PENDING  Incomplete  MRSA PCR Screening     Status: Abnormal   Collection Time: 07/17/16  1:41 AM  Result Value Ref Range Status   MRSA by PCR POSITIVE (A) NEGATIVE Final    Comment:        The GeneXpert MRSA Assay (FDA approved for NASAL specimens only), is one component of a comprehensive MRSA colonization surveillance program. It is not intended to diagnose MRSA infection nor to guide or monitor treatment for MRSA infections. CALLED JASMINE DORSETT AT 0422 ON 07/17/16 RWW READ BACK   Blood Culture (routine x 2)     Status: None (Preliminary result)   Collection Time: 07/17/16  2:38 AM  Result Value Ref Range Status   Specimen Description BLOOD RIGHT ASSIST CONTROL  Final   Special Requests   Final    BOTTLES DRAWN AEROBIC AND  ANAEROBIC 10CCAERO,6CCANA   Culture NO GROWTH < 12 HOURS  Final   Report Status PENDING  Incomplete  Blood Culture (routine x 2)     Status: None (Preliminary result)   Collection Time: 07/17/16  2:52 AM  Result Value Ref Range Status   Specimen Description BLOOD RIGHT HAND  Final   Special Requests BOTTLES DRAWN AEROBIC AND ANAEROBIC [place]  Final   Culture NO GROWTH < 12 HOURS  Final   Report Status PENDING  Incomplete        Code Status Orders        Start     Ordered   07/17/16 0121  Full code  Continuous  07/17/16 0120    Code Status History    Date Active Date Inactive Code Status Order ID Comments User Context   This patient has a current code status but no historical code status.    Advance Directive Documentation   Flowsheet Row Most Recent Value  Type of Advance Directive  Out of facility DNR (pink MOST or yellow form)  Pre-existing out of facility DNR order (yellow form or pink MOST form)  No data  "MOST" Form in Place?  No data            Discharge Medications           Total Time in preparing paper work, data evaluation and todays exam - 35 minutes  Dustin Flock M.D on 07/17/2016 at 3:09 [facility]  [facility]   Office  [phone]

## 2016-07-17 NOTE — Care Management Note (Signed)
Case Management Note  Patient Details  Name: Lonnie Morales MRN: [id number] Date of Birth: [birth year]  Subjective/Objective:       Per Dr Ulysees Barns and Lonnie Morales's request, a request for transfer to the [facility] was faxed to the [place]. Copy of consent is in the ghost chart.              Action/Plan:   Expected Discharge Date:                  Expected Discharge Plan:     In-House Referral:     Discharge planning Services     Post Acute Care Choice:    Choice offered to:     DME Arranged:    DME Agency:     HH Arranged:    HH Agency:     Status of Service:     If discussed at [employer] of Stay Meetings, dates discussed:    Additional Comments:  Allice Garro A, RN 07/17/2016, 1:25 PM

## 2016-07-17 NOTE — Progress Notes (Signed)
Patient arrived to unit from ED. Patient alert to self.Drains intact X2. Cardiac monitoring placed.

## 2016-07-19 LAB — URINE CULTURE: Culture: >=100000 — AB

## 2016-07-21 LAB — BLOOD CULTURE ID PANEL (REFLEXED)
ACINETOBACTER BAUMANNII: NOT DETECTED
CANDIDA ALBICANS: NOT DETECTED
CANDIDA GLABRATA: NOT DETECTED
CANDIDA KRUSEI: NOT DETECTED
Candida parapsilosis: NOT DETECTED
Candida tropicalis: NOT DETECTED
ENTEROBACTER CLOACAE COMPLEX: NOT DETECTED
ENTEROBACTERIACEAE SPECIES: NOT DETECTED
ENTEROCOCCUS SPECIES: NOT DETECTED
ESCHERICHIA COLI: NOT DETECTED
Haemophilus influenzae: NOT DETECTED
Klebsiella oxytoca: NOT DETECTED
Klebsiella pneumoniae: NOT DETECTED
LISTERIA MONOCYTOGENES: NOT DETECTED
Neisseria meningitidis: NOT DETECTED
PROTEUS SPECIES: NOT DETECTED
PSEUDOMONAS AERUGINOSA: NOT DETECTED
STREPTOCOCCUS PNEUMONIAE: NOT DETECTED
STREPTOCOCCUS PYOGENES: NOT DETECTED
Serratia marcescens: NOT DETECTED
Staphylococcus aureus (BCID): NOT DETECTED
Staphylococcus species: NOT DETECTED
Streptococcus agalactiae: NOT DETECTED
Streptococcus species: NOT DETECTED

## 2016-07-21 LAB — CULTURE, BLOOD (ROUTINE X 2): CULTURE: NO GROWTH

## 2016-07-21 NOTE — Progress Notes (Signed)
PHARMACY - PHYSICIAN COMMUNICATION CRITICAL VALUE ALERT - BLOOD CULTURE IDENTIFICATION (BCID)  Results for orders placed or performed during the hospital encounter of 07/16/16  Blood Culture ID Panel (Reflexed) (Collected: 07/16/2016  6:57 PM)  Result Value Ref Range   Enterococcus species NOT DETECTED NOT DETECTED   Listeria monocytogenes NOT DETECTED NOT DETECTED   Staphylococcus species NOT DETECTED NOT DETECTED   Staphylococcus aureus NOT DETECTED NOT DETECTED   Streptococcus species NOT DETECTED NOT DETECTED   Streptococcus agalactiae NOT DETECTED NOT DETECTED   Streptococcus pneumoniae NOT DETECTED NOT DETECTED   Streptococcus pyogenes NOT DETECTED NOT DETECTED   Acinetobacter baumannii NOT DETECTED NOT DETECTED   Enterobacteriaceae species NOT DETECTED NOT DETECTED   Enterobacter cloacae complex NOT DETECTED NOT DETECTED   Escherichia coli NOT DETECTED NOT DETECTED   Klebsiella oxytoca NOT DETECTED NOT DETECTED   Klebsiella pneumoniae NOT DETECTED NOT DETECTED   Proteus species NOT DETECTED NOT DETECTED   Serratia marcescens NOT DETECTED NOT DETECTED   Haemophilus influenzae NOT DETECTED NOT DETECTED   Neisseria meningitidis NOT DETECTED NOT DETECTED   Pseudomonas aeruginosa NOT DETECTED NOT DETECTED   Candida albicans NOT DETECTED NOT DETECTED   Candida glabrata NOT DETECTED NOT DETECTED   Candida krusei NOT DETECTED NOT DETECTED   Candida parapsilosis NOT DETECTED NOT DETECTED   Candida tropicalis NOT DETECTED NOT DETECTED    Name of physician (or Provider) Contacted: Shreyang Patel  Changes to prescribed antibiotics required: Spoke with discharging MD regarding BCID. Patient was transferred to [facility] on meropenem.   Lenis Noon, PharmD 07/21/2016  2:43 PM

## 2016-07-22 LAB — CULTURE, BLOOD (ROUTINE X 2)
CULTURE: NO GROWTH
Culture: NO GROWTH

## 2016-07-24 LAB — CULTURE, BLOOD (ROUTINE X 2)

## 2016-08-21 DEATH — deceased

## 2017-10-04 IMAGING — DX DG CHEST 1V
2 series · 2 of 2 positions shown · non-contrast
Comparison: None.

CLINICAL DATA: Altered mental status. Pt has strong urine odor. Pt
has both urostomy and colostomy. Pt would not hold still for images.

EXAM:
CHEST 1 VIEW

[chest ap (1 of 2)]
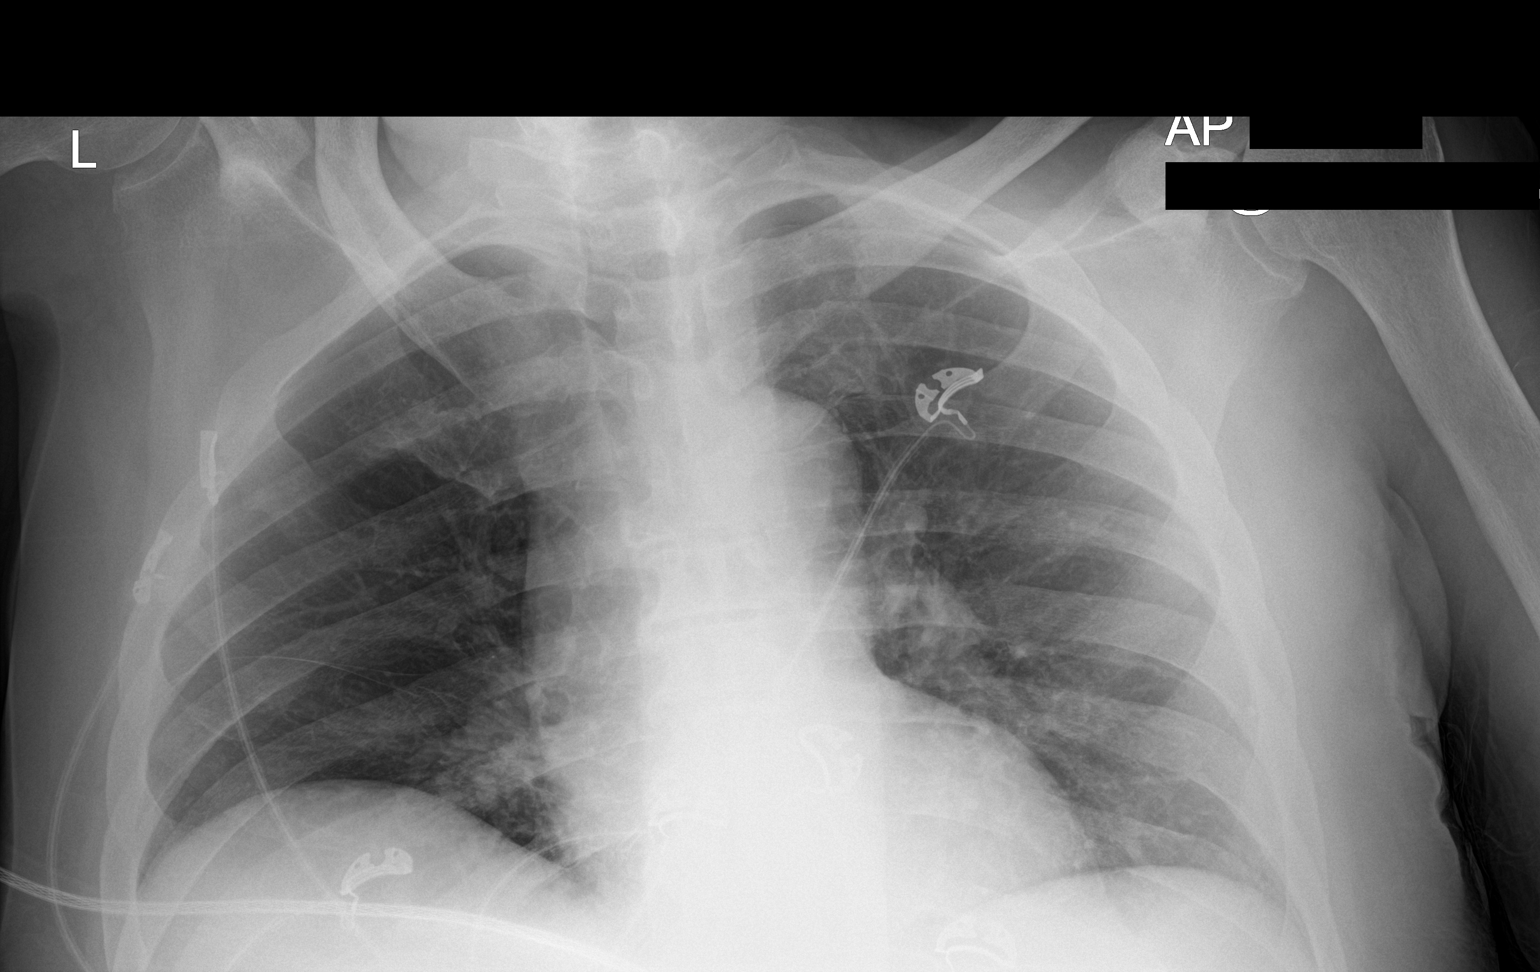

[chest ap (2 of 2)]
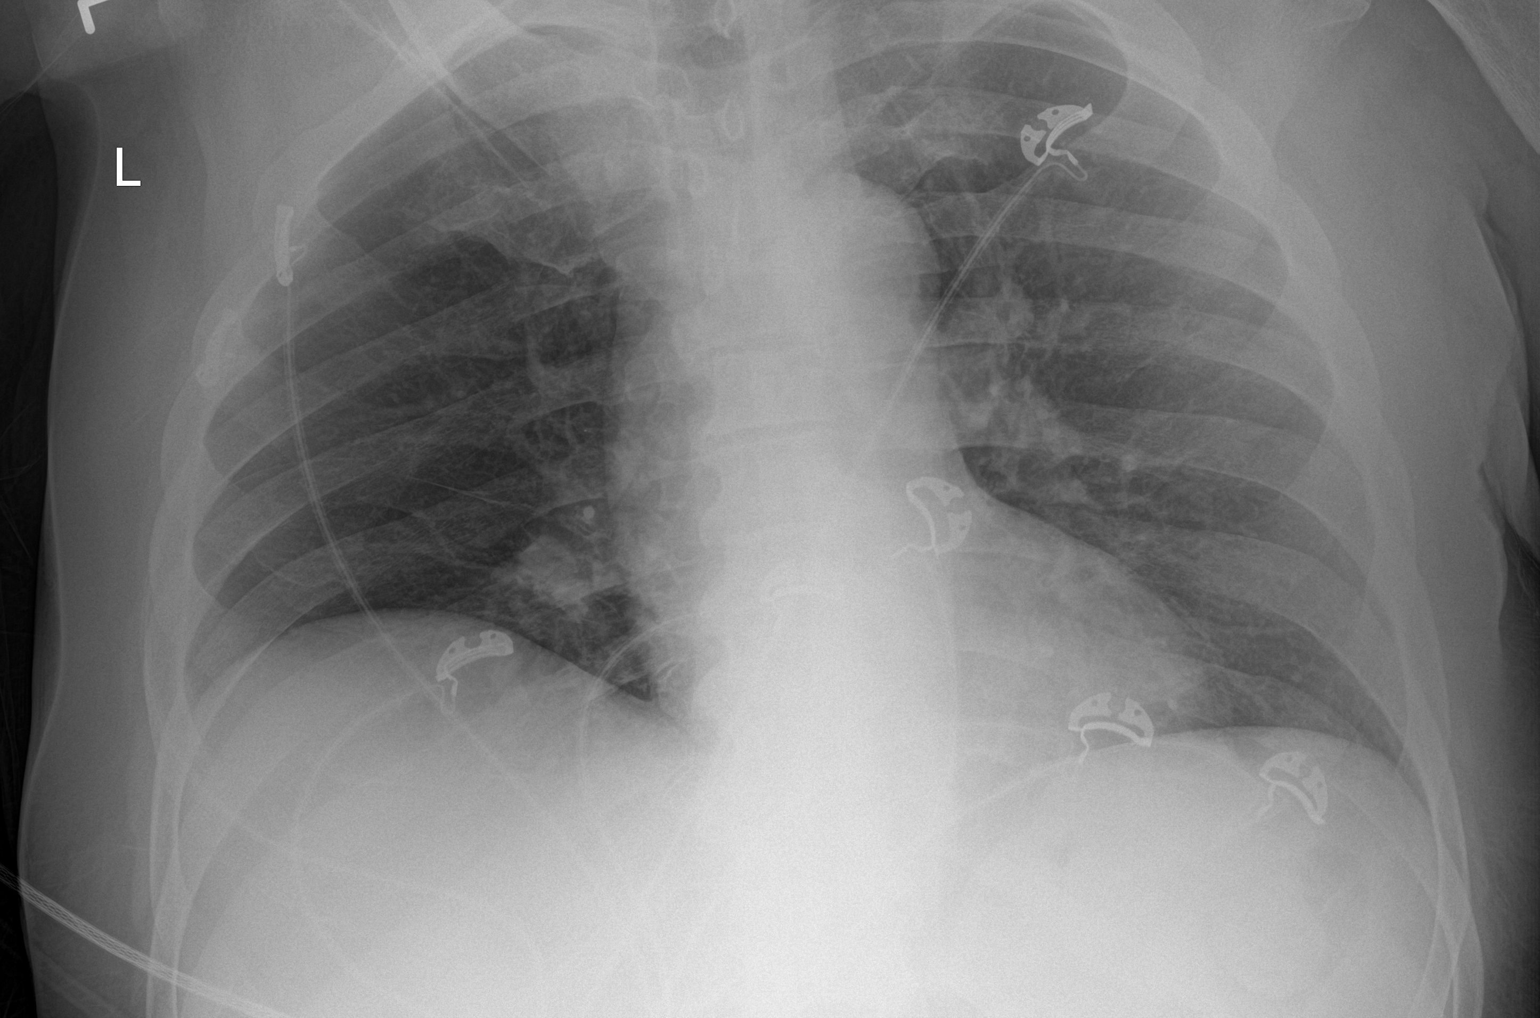

[2 of 2 positions shown; findings below may reference images not displayed]

FINDINGS: Shallow inspiration. Mild linear atelectasis in the lung bases.
Normal heart size and pulmonary vascularity. No focal airspace
disease or consolidation in the lungs. No blunting of costophrenic
angles. No pneumothorax. Mediastinal contours appear intact.
Degenerative changes in the spine.
IMPRESSION: Shallow inspiration with linear atelectasis in the lung bases.
